# Patient Record
Sex: Male | Born: 1960
Health system: Southern US, Community
[De-identification: ages and names within clinical notes are randomized; demographics above are authoritative.]

## PROBLEM LIST (undated history)

## (undated) DIAGNOSIS — T7840XA Allergy, unspecified, initial encounter: Secondary | ICD-10-CM

## (undated) DIAGNOSIS — R300 Dysuria: Secondary | ICD-10-CM

## (undated) DIAGNOSIS — N529 Male erectile dysfunction, unspecified: Secondary | ICD-10-CM

## (undated) DIAGNOSIS — I10 Essential (primary) hypertension: Secondary | ICD-10-CM

## (undated) DIAGNOSIS — Z8709 Personal history of other diseases of the respiratory system: Secondary | ICD-10-CM

## (undated) DIAGNOSIS — G4700 Insomnia, unspecified: Secondary | ICD-10-CM

## (undated) DIAGNOSIS — K219 Gastro-esophageal reflux disease without esophagitis: Secondary | ICD-10-CM

## (undated) DIAGNOSIS — M1990 Unspecified osteoarthritis, unspecified site: Secondary | ICD-10-CM

## (undated) DIAGNOSIS — T4145XA Adverse effect of unspecified anesthetic, initial encounter: Secondary | ICD-10-CM

## (undated) DIAGNOSIS — R399 Unspecified symptoms and signs involving the genitourinary system: Secondary | ICD-10-CM

## (undated) DIAGNOSIS — Z973 Presence of spectacles and contact lenses: Secondary | ICD-10-CM

## (undated) DIAGNOSIS — G4733 Obstructive sleep apnea (adult) (pediatric): Secondary | ICD-10-CM

## (undated) DIAGNOSIS — I639 Cerebral infarction, unspecified: Secondary | ICD-10-CM

## (undated) DIAGNOSIS — Z860101 Personal history of adenomatous and serrated colon polyps: Secondary | ICD-10-CM

## (undated) DIAGNOSIS — C61 Malignant neoplasm of prostate: Secondary | ICD-10-CM

## (undated) DIAGNOSIS — D126 Benign neoplasm of colon, unspecified: Secondary | ICD-10-CM

## (undated) DIAGNOSIS — J45909 Unspecified asthma, uncomplicated: Secondary | ICD-10-CM

## (undated) DIAGNOSIS — Z923 Personal history of irradiation: Secondary | ICD-10-CM

## (undated) DIAGNOSIS — C801 Malignant (primary) neoplasm, unspecified: Secondary | ICD-10-CM

## (undated) DIAGNOSIS — Z86010 Personal history of colonic polyps: Secondary | ICD-10-CM

## (undated) DIAGNOSIS — E785 Hyperlipidemia, unspecified: Secondary | ICD-10-CM

## (undated) DIAGNOSIS — R310 Gross hematuria: Secondary | ICD-10-CM

## (undated) DIAGNOSIS — F419 Anxiety disorder, unspecified: Secondary | ICD-10-CM

## (undated) DIAGNOSIS — R002 Palpitations: Secondary | ICD-10-CM

## (undated) DIAGNOSIS — I872 Venous insufficiency (chronic) (peripheral): Secondary | ICD-10-CM

## (undated) DIAGNOSIS — T8859XA Other complications of anesthesia, initial encounter: Secondary | ICD-10-CM

## (undated) DIAGNOSIS — G4730 Sleep apnea, unspecified: Secondary | ICD-10-CM

## (undated) DIAGNOSIS — Z720 Tobacco use: Secondary | ICD-10-CM

## (undated) HISTORY — DX: Anxiety disorder, unspecified: F41.9

## (undated) HISTORY — PX: MOUTH SURGERY: SHX715

## (undated) HISTORY — DX: Allergy, unspecified, initial encounter: T78.40XA

## (undated) HISTORY — PX: TYMPANOSTOMY TUBE PLACEMENT: SHX32

## (undated) HISTORY — DX: Unspecified osteoarthritis, unspecified site: M19.90

## (undated) HISTORY — DX: Essential (primary) hypertension: I10

## (undated) HISTORY — DX: Tobacco use: Z72.0

## (undated) HISTORY — DX: Venous insufficiency (chronic) (peripheral): I87.2

## (undated) HISTORY — DX: Cerebral infarction, unspecified: I63.9

## (undated) HISTORY — DX: Benign neoplasm of colon, unspecified: D12.6

## (undated) HISTORY — DX: Unspecified asthma, uncomplicated: J45.909

## (undated) HISTORY — DX: Hyperlipidemia, unspecified: E78.5

## (undated) HISTORY — PX: TONSILLECTOMY: SUR1361

## (undated) HISTORY — DX: Gastro-esophageal reflux disease without esophagitis: K21.9

## (undated) HISTORY — PX: POLYPECTOMY: SHX149

## (undated) HISTORY — DX: Palpitations: R00.2

## (undated) HISTORY — PX: COLONOSCOPY: SHX174

## (undated) HISTORY — PX: BILATERAL KNEE ARTHROSCOPY: SUR91

## (undated) HISTORY — DX: Obstructive sleep apnea (adult) (pediatric): G47.33

## (undated) HISTORY — DX: Insomnia, unspecified: G47.00

## (undated) HISTORY — DX: Sleep apnea, unspecified: G47.30

---

## 1998-06-23 HISTORY — PX: VASECTOMY: SHX75

## 1999-01-22 ENCOUNTER — Emergency Department (HOSPITAL_COMMUNITY): Admission: EM | Admit: 1999-01-22 | Discharge: 1999-01-22 | Payer: Self-pay | Admitting: *Deleted

## 2011-01-24 ENCOUNTER — Emergency Department (HOSPITAL_COMMUNITY)
Admission: EM | Admit: 2011-01-24 | Discharge: 2011-01-24 | Disposition: A | Discharge status: Home / Self Care | Payer: BC Managed Care – PPO | Attending: Emergency Medicine | Admitting: Emergency Medicine

## 2011-01-24 DIAGNOSIS — I10 Essential (primary) hypertension: Secondary | ICD-10-CM | POA: Insufficient documentation

## 2011-01-24 DIAGNOSIS — K625 Hemorrhage of anus and rectum: Secondary | ICD-10-CM | POA: Insufficient documentation

## 2011-01-24 DIAGNOSIS — R109 Unspecified abdominal pain: Secondary | ICD-10-CM | POA: Insufficient documentation

## 2011-01-24 DIAGNOSIS — R112 Nausea with vomiting, unspecified: Secondary | ICD-10-CM | POA: Insufficient documentation

## 2011-01-24 LAB — CBC
HCT: 44.5 % (ref 39.0–52.0)
Hemoglobin: 16 g/dL (ref 13.0–17.0)
MCH: 31.3 pg (ref 26.0–34.0)
MCHC: 36 g/dL (ref 30.0–36.0)
MCV: 86.9 fL (ref 78.0–100.0)
Platelets: 261 10*3/uL (ref 150–400)
RBC: 5.12 MIL/uL (ref 4.22–5.81)
RDW: 14 % (ref 11.5–15.5)
WBC: 11 10*3/uL — ABNORMAL HIGH (ref 4.0–10.5)

## 2011-01-24 LAB — DIFFERENTIAL
Basophils Absolute: 0 10*3/uL (ref 0.0–0.1)
Basophils Relative: 0 % (ref 0–1)
Eosinophils Absolute: 0.3 10*3/uL (ref 0.0–0.7)
Eosinophils Relative: 3 % (ref 0–5)
Lymphocytes Relative: 25 % (ref 12–46)
Lymphs Abs: 2.7 10*3/uL (ref 0.7–4.0)
Monocytes Absolute: 0.8 10*3/uL (ref 0.1–1.0)
Monocytes Relative: 7 % (ref 3–12)
Neutro Abs: 7.2 10*3/uL (ref 1.7–7.7)
Neutrophils Relative %: 65 % (ref 43–77)

## 2011-01-24 LAB — POCT I-STAT, CHEM 8
BUN: 15 mg/dL (ref 6–23)
Calcium, Ion: 1.22 mmol/L (ref 1.12–1.32)
Chloride: 100 mEq/L (ref 96–112)
Creatinine, Ser: 1 mg/dL (ref 0.50–1.35)
Glucose, Bld: 97 mg/dL (ref 70–99)
HCT: 49 % (ref 39.0–52.0)
Hemoglobin: 16.7 g/dL (ref 13.0–17.0)
Potassium: 3.3 mEq/L — ABNORMAL LOW (ref 3.5–5.1)
Sodium: 141 mEq/L (ref 135–145)
TCO2: 31 mmol/L (ref 0–100)

## 2011-01-24 LAB — OCCULT BLOOD, POC DEVICE: Fecal Occult Bld: POSITIVE

## 2011-06-24 HISTORY — PX: COLONOSCOPY: SHX174

## 2011-07-15 ENCOUNTER — Encounter: Payer: Self-pay | Admitting: Gastroenterology

## 2011-07-29 ENCOUNTER — Ambulatory Visit (INDEPENDENT_AMBULATORY_CARE_PROVIDER_SITE_OTHER): Payer: BC Managed Care – PPO | Admitting: Gastroenterology

## 2011-07-29 ENCOUNTER — Encounter: Payer: Self-pay | Admitting: Gastroenterology

## 2011-07-29 VITALS — BP 136/88 | HR 68 | Ht 73.0 in | Wt 231.2 lb

## 2011-07-29 DIAGNOSIS — Z1211 Encounter for screening for malignant neoplasm of colon: Secondary | ICD-10-CM

## 2011-07-29 DIAGNOSIS — G4733 Obstructive sleep apnea (adult) (pediatric): Secondary | ICD-10-CM

## 2011-07-29 DIAGNOSIS — Z83719 Family history of colon polyps, unspecified: Secondary | ICD-10-CM

## 2011-07-29 DIAGNOSIS — Z8371 Family history of colonic polyps: Secondary | ICD-10-CM

## 2011-07-29 MED ORDER — PEG-KCL-NACL-NASULF-NA ASC-C 100 G PO SOLR: 1.0000 | Freq: Once | ORAL | Status: DC

## 2011-07-29 NOTE — Progress Notes (Signed)
History of Present Illness: This is a 51 year old male here to discuss colorectal cancer screening. He had had 2 episodes of minor bright red rectal bleeding associated with bowel movements, once in August and once in December. He was evaluated in the emergency department in August for rectal bleeding in those records were reviewed. He had a normal hemoglobin at that time. His father has colon pol I.yps. Denies weight loss, abdominal pain, constipation, diarrhea, change in stool caliber, melena, nausea, vomiting, dysphagia, reflux symptoms, chest pain.  Not on File Outpatient Prescriptions Prior to Visit  Medication Sig Dispense Refill  . felodipine (PLENDIL) 10 MG 24 hr tablet Take 10 mg by mouth daily.      Marland Kitchen losartan-hydrochlorothiazide (HYZAAR) 100-25 MG per tablet Take 0.5 tablets by mouth daily.      . Nebivolol HCl (BYSTOLIC) 20 MG TABS Take 1 tablet by mouth daily.      Marland Kitchen zolpidem (AMBIEN) 10 MG tablet Take 10 mg by mouth at bedtime as needed.       Past Medical History  Diagnosis Date  . Asthma   . DJD (degenerative joint disease)   . Hypertension   . Obstructive sleep apnea     on CPAP  . Palpitations   . Allergic rhinitis   . Insomnia    Past Surgical History  Procedure Date  . Bilateral knee arthroscopy [id number]  . Tonsillectomy   . Tubes in ears    History   Social History  . Marital Status: Single    Spouse Name: N/A    Number of Children: 3  . Years of Education: N/A   Occupational History  . Sales Rep at [employer]   .     Social History Main Topics  . Smoking status: Current Everyday Smoker -- 1.5 packs/day for 30 years  . Smokeless tobacco: Never Used  . Alcohol Use: Yes     rarely  . Drug Use: No  . Sexually Active: None   Other Topics Concern  . None   Social History Narrative  . None   Family History  Problem Relation Age of Onset  . Arthritis Mother   . Fibromyalgia Mother   . Coronary artery disease Maternal Grandmother   .  Coronary artery disease Maternal Grandfather   . Hypertension Brother   . Skin cancer Brother   . Colon polyps Father    Review of Systems: Pertinent positive and negative review of systems were noted in the above HPI section. All other review of systems were otherwise negative.  Physical Exam: General: Well developed , well nourished, no acute distress Head: Normocephalic and atraumatic Eyes:  sclerae anicteric, EOMI Ears: Normal auditory acuity Mouth: No deformity or lesions Neck: Supple, no masses or thyromegaly Lungs: Clear throughout to auscultation Heart: Regular rate and rhythm; no murmurs, rubs or bruits Abdomen: Soft, non tender and non distended. No masses, hepatosplenomegaly or hernias noted. Normal Bowel sounds Rectal: Deferred to colonoscopy, DRE last week by Dr. Felipa Eth showed no lesions  Musculoskeletal: Symmetrical with no gross deformities  Skin: No lesions on visible extremities Pulses:  Normal pulses noted Extremities: No clubbing, cyanosis, edema or deformities noted Neurological: Alert oriented x 4, grossly nonfocal Cervical Nodes:  No significant cervical adenopathy Inguinal Nodes: No significant inguinal adenopathy Psychological:  Alert and cooperative. Normal mood and affect  Assessment and Recommendations:  1. Colorectal cancer screening. Father with colon polyps. Minor rectal bleeding, likely a benign anorectal source. The risks, benefits, and alternatives to  colonoscopy with possible biopsy, possible destruction of internal hemorrhoids and possible polypectomy were discussed with the patient and they consent to proceed.   2. Obstructive sleep apnea on CPAP.

## 2011-07-29 NOTE — Patient Instructions (Signed)
You have been scheduled for a Colonoscopy with propofol. See separate instructions.  Pick up your prep kit from your pharmacy.  cc: Ravisankar Avva, MD 

## 2011-08-26 ENCOUNTER — Encounter: Payer: Self-pay | Admitting: Gastroenterology

## 2011-08-26 ENCOUNTER — Ambulatory Visit (AMBULATORY_SURGERY_CENTER): Payer: BC Managed Care – PPO | Admitting: Gastroenterology

## 2011-08-26 DIAGNOSIS — D126 Benign neoplasm of colon, unspecified: Secondary | ICD-10-CM

## 2011-08-26 DIAGNOSIS — Z1211 Encounter for screening for malignant neoplasm of colon: Secondary | ICD-10-CM

## 2011-08-26 HISTORY — DX: Benign neoplasm of colon, unspecified: D12.6

## 2011-08-26 MED ORDER — SODIUM CHLORIDE 0.9 % IV SOLN: 500.0000 mL | INTRAVENOUS | Status: DC

## 2011-08-26 NOTE — Patient Instructions (Signed)

## 2011-08-26 NOTE — Progress Notes (Signed)
Patient did not have preoperative order for IV antibiotic SSI prophylaxis. (G8918)  Patient did not experience any of the following events: a burn prior to discharge; a fall within the facility; wrong site/side/patient/procedure/implant event; or a hospital transfer or hospital admission upon discharge from the facility. (G8907)  

## 2011-08-26 NOTE — Op Note (Signed)
[facility] [address] [place], [place]  [address]  COLONOSCOPY PROCEDURE REPORT PATIENT:  Keith Stone, Keith Stone  MR#:  [id number] BIRTHDATE:  1961/04/15, 50 yrs. old  GENDER:  male ENDOSCOPIST:  Judie Petit T. Russella Dar, MD, [facility] Referred by:  Chilton Greathouse, M.D. PROCEDURE DATE:  08/26/2011 PROCEDURE:  Colonoscopy with snare polypectomy ASA CLASS:  Class II INDICATIONS:  1) Routine Risk Screening MEDICATIONS:   MAC sedation, administered by CRNA, propofol (Diprivan) 300 mg IV DESCRIPTION OF PROCEDURE:   After the risks benefits and alternatives of the procedure were thoroughly explained, informed consent was obtained.  Digital rectal exam was performed and revealed no abnormalities.   The LB [id number]-[id number] endoscope was introduced through the anus and advanced to the cecum, which was identified by both the appendix and ileocecal valve, without limitations.  The quality of the prep was excellent, using MoviPrep.  The instrument was then slowly withdrawn as the colon was fully examined. <<PROCEDUREIMAGES>> FINDINGS:  A sessile polyp was found in the descending colon. It was 6 mm in size. Polyp was snared, then cauterized with monopolar cautery. Retrieval was successful. Three polyps were found in the rectum. They were 5 - 6 mm in size. Two polyps were snared without cautery and one with cautery. Retrieval was successful. Otherwise normal colonoscopy without other polyps, masses, vascular ectasias, or inflammatory changes.   Retroflexed views in the rectum revealed no abnormalities.  The time to cecum =  3.67 minutes. The scope was then withdrawn (time =  17.5  min) from the patient and the procedure completed.  COMPLICATIONS:  None  ENDOSCOPIC IMPRESSION: 1) 6 mm sessile polyp in the descending colon 2) 5 - 6 mm Three polyps in the rectum  RECOMMENDATIONS: 1) Hold aspirin, aspirin products, and anti-inflammatory medication for 2 weeks. 2) Await pathology results 3) If  3 or 4 polyps are adenomatous (pre-cancerous), repeat colonoscopy in 3 years. If 1 or 2 adenomatous, repeat colonoscopy in 5 years. Otherwise follow colorectal cancer screening guidelines for "routine risk" patients with colonoscopy in 10 years. Venita Lick. Russella Dar, MD, [facility]  n. eSIGNED:   Venita Lick. Kalena Mander at 08/26/2011 10:16 AM  Gala Romney, [phone]

## 2011-08-27 ENCOUNTER — Telehealth: Payer: Self-pay | Admitting: *Deleted

## 2011-08-27 NOTE — Telephone Encounter (Signed)
  Follow up Call-  Call back number 08/26/2011  Post procedure Call Back phone  # [phone]  Permission to leave phone message Yes     Patient questions:  Do you have a fever, pain , or abdominal swelling? no Pain Score  0 *  Have you tolerated food without any problems? yes  Have you been able to return to your normal activities? yes  Do you have any questions about your discharge instructions: Diet   no Medications  no Follow up visit  no  Do you have questions or concerns about your Care? no  Actions: * If pain score is 4 or above: No action needed, pain <4.

## 2011-09-06 ENCOUNTER — Encounter: Payer: Self-pay | Admitting: Gastroenterology

## 2013-09-01 ENCOUNTER — Encounter: Payer: Self-pay | Admitting: *Deleted

## 2013-09-07 ENCOUNTER — Ambulatory Visit (INDEPENDENT_AMBULATORY_CARE_PROVIDER_SITE_OTHER): Payer: BC Managed Care – PPO | Admitting: Nurse Practitioner

## 2013-09-07 ENCOUNTER — Encounter: Payer: Self-pay | Admitting: Nurse Practitioner

## 2013-09-07 VITALS — BP 134/72 | HR 68 | Ht 73.0 in | Wt 239.0 lb

## 2013-09-07 DIAGNOSIS — R195 Other fecal abnormalities: Secondary | ICD-10-CM | POA: Insufficient documentation

## 2013-09-07 DIAGNOSIS — R1012 Left upper quadrant pain: Secondary | ICD-10-CM | POA: Insufficient documentation

## 2013-09-07 DIAGNOSIS — R110 Nausea: Secondary | ICD-10-CM

## 2013-09-07 DIAGNOSIS — K625 Hemorrhage of anus and rectum: Secondary | ICD-10-CM

## 2013-09-07 MED ORDER — OMEPRAZOLE 40 MG PO CPDR: 40.0000 mg | DELAYED_RELEASE_CAPSULE | Freq: Every day | ORAL | Status: DC

## 2013-09-07 NOTE — Progress Notes (Signed)
     History of Present Illness:  Patient is a 53 year old male known to Dr. Fuller Plan. He had a complete colonoscopy for screening purposes March 2013 at which time 3-4 polyps were removed, one of which was adenomatous in the rest hyperplastic. Patient is referred today for evaluation of a positive hemosure study. No overt bleeding. Over the last couple months he's had intermittent loose stool interspersed with normal bowel habits. No weight loss, weight is actually up. The patient describes what sounds like positive Hemoccult test as well. He takes a baby aspirin daily. No other NSAIDs. Patient does complain of some intermittent nausea and upper abdominal burning especially when stomach is empty.    Patient's wife accompanies him today. They're both concerned about patient's excessive fatigue. He's been requiring naps, unusual for him. Recent labs including a TSH were normal. Of note, patient's blood pressure medication was changed from Coreg to Bystolic for cost reasons not too long ago.   Current Medications, Allergies, Past Medical History, Past Surgical History, Family History and Social History were reviewed in [employer] [employer] record.  Physical Exam: General: Pleasant, well developed , white male in no acute distress Head: Normocephalic and atraumatic Eyes:  sclerae anicteric, conjunctiva pink  Ears: Normal auditory acuity Lungs: Clear throughout to auscultation Heart: Regular rate and rhythm Abdomen: Soft, non distended, non-tender. No masses, no hepatomegaly. Normal bowel sounds Musculoskeletal: Symmetrical with no gross deformities  Extremities: No edema  Neurological: Alert oriented x 4, grossly nonfocal Psychological:  Alert and cooperative. Normal mood and affect  Assessment and Recommendations:  26. 53 year old male referred for a positive Hemosure he had 2 of 3 positive hemoccult tests as well.  Other than some minor bowel changes over the last couple of months (  intermittent loose stool), no other alarm symptoms such as weight loss or overt bleeding and his hgb is 15.2. Patient had a complete colonoscopy March 2013 with findings of 4 small polyps, one of which was adenomatous. Colon neoplasm seems unlikely but will repeat Hemosure.   2. Hemoccult positive stools, 2 of 3 cards. This is not specific to upper of lower bleeding. Patient takes a daily baby ASA. He endorses upper abdominal burning and intermittent nausea with could be NSAID induced gastropathy. Will start him on a PPI. Return to clinic in 3-4 weeks. Depending on clinical course he may need EGD.

## 2013-09-07 NOTE — Progress Notes (Signed)
Reviewed and agree with management plan. Would repeat Hemosure or Hemoccults after he had taken a PPI daily for at least 4 weeks.  Pricilla Riffle. Fuller Plan, MD [facility]

## 2013-09-07 NOTE — Patient Instructions (Addendum)
Your physician has requested that you go to the basement for the following lab work before leaving today: IFOB  You have a follow up appointment with Dr. Fuller Plan on 10-12-2013 at 59 am. If you are having any problems before your appointment please call the office.  We have sent the following medications to your pharmacy for you to pick up at your convenience: Omeprazole 40 mg, please take one capsule by mouth thirty minutes before breakfast

## 2013-10-06 ENCOUNTER — Telehealth: Payer: Self-pay | Admitting: Gastroenterology

## 2013-10-06 ENCOUNTER — Other Ambulatory Visit (INDEPENDENT_AMBULATORY_CARE_PROVIDER_SITE_OTHER): Payer: BC Managed Care – PPO

## 2013-10-06 DIAGNOSIS — K625 Hemorrhage of anus and rectum: Secondary | ICD-10-CM

## 2013-10-06 LAB — FECAL OCCULT BLOOD, IMMUNOCHEMICAL: Fecal Occult Bld: NEGATIVE

## 2013-10-10 ENCOUNTER — Telehealth: Payer: Self-pay | Admitting: Gastroenterology

## 2013-10-10 NOTE — Telephone Encounter (Signed)
Patient notified of stool card results.  Dr. Fuller Plan recommended stool card after 3-4 week on PPI.  Patient reports he has been on for over 1 month.  He asked that I cancel the appt.  He will call back with any new symptoms.

## 2013-10-12 ENCOUNTER — Ambulatory Visit: Payer: BC Managed Care – PPO | Admitting: Gastroenterology

## 2013-10-18 NOTE — Telephone Encounter (Signed)
See 10/10/13 note

## 2013-11-22 HISTORY — PX: PROSTATE BIOPSY: SHX241

## 2014-01-17 ENCOUNTER — Other Ambulatory Visit: Payer: Self-pay | Admitting: Urology

## 2014-02-13 ENCOUNTER — Encounter (HOSPITAL_COMMUNITY): Payer: Self-pay | Admitting: Pharmacist

## 2014-02-15 ENCOUNTER — Other Ambulatory Visit (HOSPITAL_COMMUNITY): Payer: Self-pay | Admitting: Anesthesiology

## 2014-02-15 NOTE — Patient Instructions (Addendum)
[place]  02/15/2014   Your procedure is scheduled on:  Wednesday September 2nd, 2015  Report to [facility] Main Entrance and follow signs to  [place] at 630  AM.  Call this number if you have problems the morning of surgery [phone]   Remember: follow all bowel prep instructions from dr grapey.  Do not eat food  :After Midnight Monday night, clear liquids all day Tuesday sept 1, 2015, no clear liquids after midnight Tuesday night.   BRING CPAP MASK AND TUBING   Take these medicines the morning of surgery with A SIP OF WATER: carvedilol (coreg)                               You may not have any metal on your body including hair pins and piercings  Do not wear jewelry, make-up, lotions, powders, or deodorant.   Men may shave face and neck.  Do not bring valuables to the hospital. [place].  Contacts, dentures or bridgework may not be worn into surgery.  Leave suitcase in the car. After surgery it may be brought to your room.  For patients admitted to the hospital, checkout time is 11:00 AM the day of discharge.   Patients discharged the day of surgery will not be allowed to drive home.  Name and phone number of your driver:  Special Instructions: N/A ________________________________________________________________________  [facility] - Preparing for Surgery Before surgery, you can play an important role.  Because skin is not sterile, your skin needs to be as free of germs as possible.  You can reduce the number of germs on your skin by washing with CHG (chlorahexidine gluconate) soap before surgery.  CHG is an antiseptic cleaner which kills germs and bonds with the skin to continue killing germs even after washing. Please DO NOT use if you have an allergy to CHG or antibacterial soaps.  If your skin becomes reddened/irritated stop using the CHG and inform your nurse when you arrive at Short Stay. Do not shave  (including legs and underarms) for at least 48 hours prior to the first CHG shower.  You may shave your face/neck. Please follow these instructions carefully:  1.  Shower with CHG Soap the night before surgery and the  morning of Surgery.  2.  If you choose to wash your hair, wash your hair first as usual with your  normal  shampoo.  3.  After you shampoo, rinse your hair and body thoroughly to remove the  shampoo.                           4.  Use CHG as you would any other liquid soap.  You can apply chg directly  to the skin and wash                       Gently with a scrungie or clean washcloth.  5.  Apply the CHG Soap to your body ONLY FROM THE NECK DOWN.   Do not use on face/ open                           Wound or open sores. Avoid contact with eyes, ears mouth and genitals (private parts).  Wash face,  Genitals (private parts) with your normal soap.             6.  Wash thoroughly, paying special attention to the area where your surgery  will be performed.  7.  Thoroughly rinse your body with warm water from the neck down.  8.  DO NOT shower/wash with your normal soap after using and rinsing off  the CHG Soap.                9.  Pat yourself dry with a clean towel.            10.  Wear clean pajamas.            11.  Place clean sheets on your bed the night of your first shower and do not  sleep with pets. Day of Surgery : Do not apply any lotions/deodorants the morning of surgery.  Please wear clean clothes to the hospital/surgery center.  FAILURE TO FOLLOW THESE INSTRUCTIONS MAY RESULT IN THE CANCELLATION OF YOUR SURGERY PATIENT SIGNATURE_________________________________  NURSE SIGNATURE__________________________________  ________________________________________________________________________

## 2014-02-16 ENCOUNTER — Ambulatory Visit (HOSPITAL_COMMUNITY)
Admission: RE | Admit: 2014-02-16 | Discharge: 2014-02-16 | Disposition: A | Discharge status: Home / Self Care | Payer: BC Managed Care – PPO | Source: Ambulatory Visit | Attending: Anesthesiology | Admitting: Anesthesiology

## 2014-02-16 ENCOUNTER — Encounter (INDEPENDENT_AMBULATORY_CARE_PROVIDER_SITE_OTHER): Payer: Self-pay

## 2014-02-16 ENCOUNTER — Encounter (HOSPITAL_COMMUNITY)
Admission: RE | Admit: 2014-02-16 | Discharge: 2014-02-16 | Disposition: A | Discharge status: Home / Self Care | Payer: BC Managed Care – PPO | Source: Ambulatory Visit | Attending: Urology | Admitting: Urology

## 2014-02-16 ENCOUNTER — Encounter (HOSPITAL_COMMUNITY): Payer: Self-pay

## 2014-02-16 DIAGNOSIS — C61 Malignant neoplasm of prostate: Secondary | ICD-10-CM | POA: Insufficient documentation

## 2014-02-16 DIAGNOSIS — Z01818 Encounter for other preprocedural examination: Secondary | ICD-10-CM | POA: Diagnosis present

## 2014-02-16 DIAGNOSIS — I10 Essential (primary) hypertension: Secondary | ICD-10-CM | POA: Insufficient documentation

## 2014-02-16 HISTORY — DX: Other complications of anesthesia, initial encounter: T88.59XA

## 2014-02-16 HISTORY — DX: Adverse effect of unspecified anesthetic, initial encounter: T41.45XA

## 2014-02-16 HISTORY — DX: Malignant (primary) neoplasm, unspecified: C80.1

## 2014-02-16 LAB — CBC
HCT: 40.9 % (ref 39.0–52.0)
Hemoglobin: 14.3 g/dL (ref 13.0–17.0)
MCH: 29.7 pg (ref 26.0–34.0)
MCHC: 35 g/dL (ref 30.0–36.0)
MCV: 85 fL (ref 78.0–100.0)
Platelets: 238 10*3/uL (ref 150–400)
RBC: 4.81 MIL/uL (ref 4.22–5.81)
RDW: 12.8 % (ref 11.5–15.5)
WBC: 6.1 10*3/uL (ref 4.0–10.5)

## 2014-02-16 LAB — BASIC METABOLIC PANEL
Anion gap: 15 (ref 5–15)
BUN: 18 mg/dL (ref 6–23)
CO2: 28 mEq/L (ref 19–32)
Calcium: 9.7 mg/dL (ref 8.4–10.5)
Chloride: 97 mEq/L (ref 96–112)
Creatinine, Ser: 0.96 mg/dL (ref 0.50–1.35)
GFR calc Af Amer: >90 mL/min (ref >90)
GFR calc non Af Amer: >90 mL/min (ref >90)
Glucose, Bld: 93 mg/dL (ref 70–99)
Potassium: 3.6 mEq/L — ABNORMAL LOW (ref 3.7–5.3)
Sodium: 140 mEq/L (ref 137–147)

## 2014-02-16 NOTE — Progress Notes (Signed)
EKG DR AVVA 01-23-14 ON CHART EKG 08-02-13 DR AVVA ON CHART

## 2014-02-22 ENCOUNTER — Encounter (HOSPITAL_COMMUNITY): Payer: Self-pay | Admitting: *Deleted

## 2014-02-22 ENCOUNTER — Inpatient Hospital Stay (HOSPITAL_COMMUNITY): Payer: BC Managed Care – PPO | Admitting: Anesthesiology

## 2014-02-22 ENCOUNTER — Inpatient Hospital Stay (HOSPITAL_COMMUNITY)
Admission: RE | Admit: 2014-02-22 | Discharge: 2014-02-23 | DRG: 708 | Disposition: A | Discharge status: Home / Self Care | Payer: BC Managed Care – PPO | Source: Ambulatory Visit | Attending: Urology | Admitting: Urology

## 2014-02-22 ENCOUNTER — Encounter (HOSPITAL_COMMUNITY): Payer: BC Managed Care – PPO | Admitting: Anesthesiology

## 2014-02-22 ENCOUNTER — Encounter (HOSPITAL_COMMUNITY)
Admission: RE | Disposition: A | Discharge status: Home / Self Care | Payer: Self-pay | Source: Ambulatory Visit | Attending: Urology

## 2014-02-22 DIAGNOSIS — G4730 Sleep apnea, unspecified: Secondary | ICD-10-CM | POA: Diagnosis present

## 2014-02-22 DIAGNOSIS — F411 Generalized anxiety disorder: Secondary | ICD-10-CM | POA: Diagnosis present

## 2014-02-22 DIAGNOSIS — Z87891 Personal history of nicotine dependence: Secondary | ICD-10-CM

## 2014-02-22 DIAGNOSIS — I10 Essential (primary) hypertension: Secondary | ICD-10-CM | POA: Diagnosis present

## 2014-02-22 DIAGNOSIS — C61 Malignant neoplasm of prostate: Secondary | ICD-10-CM | POA: Diagnosis present

## 2014-02-22 DIAGNOSIS — Z79899 Other long term (current) drug therapy: Secondary | ICD-10-CM | POA: Diagnosis not present

## 2014-02-22 DIAGNOSIS — Z8249 Family history of ischemic heart disease and other diseases of the circulatory system: Secondary | ICD-10-CM

## 2014-02-22 DIAGNOSIS — Z8349 Family history of other endocrine, nutritional and metabolic diseases: Secondary | ICD-10-CM | POA: Diagnosis not present

## 2014-02-22 HISTORY — PX: ROBOT ASSISTED LAPAROSCOPIC RADICAL PROSTATECTOMY: SHX5141

## 2014-02-22 HISTORY — PX: LYMPHADENECTOMY: SHX5960

## 2014-02-22 LAB — TYPE AND SCREEN
ABO/RH(D): A NEG
Antibody Screen: NEGATIVE

## 2014-02-22 LAB — HEMOGLOBIN AND HEMATOCRIT, BLOOD
HCT: 37.7 % — ABNORMAL LOW (ref 39.0–52.0)
Hemoglobin: 13 g/dL (ref 13.0–17.0)

## 2014-02-22 LAB — ABO/RH: ABO/RH(D): A NEG

## 2014-02-22 SURGERY — ROBOTIC ASSISTED LAPAROSCOPIC RADICAL PROSTATECTOMY
Anesthesia: General

## 2014-02-22 MED ORDER — HYDROMORPHONE HCL PF 1 MG/ML IJ SOLN: 0.2500 mg | INTRAMUSCULAR | Status: DC | PRN

## 2014-02-22 MED ORDER — HEPARIN SODIUM (PORCINE) 1000 UNIT/ML IJ SOLN
INTRAMUSCULAR | Status: AC
  Filled 2014-02-22: qty 1

## 2014-02-22 MED ORDER — SODIUM CHLORIDE 0.9 % IV BOLUS (SEPSIS)
1000.0000 mL | Freq: Once | INTRAVENOUS | Status: AC
  Administered 2014-02-22: 1000 mL via INTRAVENOUS

## 2014-02-22 MED ORDER — PROPOFOL 10 MG/ML IV BOLUS
INTRAVENOUS | Status: AC
  Filled 2014-02-22: qty 20

## 2014-02-22 MED ORDER — BELLADONNA ALKALOIDS-OPIUM 16.2-60 MG RE SUPP
RECTAL | Status: AC
  Filled 2014-02-22: qty 1

## 2014-02-22 MED ORDER — KETOROLAC TROMETHAMINE 15 MG/ML IJ SOLN
15.0000 mg | Freq: Four times a day (QID) | INTRAMUSCULAR | Status: DC
Start: 2014-02-22 — End: 2014-02-23
  Administered 2014-02-22 – 2014-02-23 (×3): 15 mg via INTRAVENOUS
  Filled 2014-02-22 (×5): qty 1

## 2014-02-22 MED ORDER — MEPERIDINE HCL 50 MG/ML IJ SOLN
INTRAMUSCULAR | Status: AC
  Filled 2014-02-22: qty 1

## 2014-02-22 MED ORDER — OXYCODONE HCL 5 MG PO TABS: 5.0000 mg | ORAL_TABLET | Freq: Once | ORAL | Status: DC | PRN

## 2014-02-22 MED ORDER — BUPIVACAINE-EPINEPHRINE (PF) 0.25% -1:200000 IJ SOLN
INTRAMUSCULAR | Status: AC
  Filled 2014-02-22: qty 30

## 2014-02-22 MED ORDER — SODIUM CHLORIDE 0.9 % IJ SOLN
INTRAMUSCULAR | Status: AC
  Filled 2014-02-22: qty 10

## 2014-02-22 MED ORDER — MIDAZOLAM HCL 2 MG/2ML IJ SOLN
0.5000 mg | INTRAMUSCULAR | Status: DC | PRN
  Administered 2014-02-22: 0.5 mg via INTRAVENOUS

## 2014-02-22 MED ORDER — CEFAZOLIN SODIUM-DEXTROSE 2-3 GM-% IV SOLR
2.0000 g | INTRAVENOUS | Status: AC
  Administered 2014-02-22: 2 g via INTRAVENOUS

## 2014-02-22 MED ORDER — HYDROMORPHONE HCL PF 1 MG/ML IJ SOLN
INTRAMUSCULAR | Status: AC
  Filled 2014-02-22: qty 1

## 2014-02-22 MED ORDER — DEXTROSE IN LACTATED RINGERS 5 % IV SOLN
INTRAVENOUS | Status: DC
  Administered 2014-02-22 – 2014-02-23 (×3): via INTRAVENOUS

## 2014-02-22 MED ORDER — PROMETHAZINE HCL 25 MG/ML IJ SOLN: 6.2500 mg | INTRAMUSCULAR | Status: DC | PRN

## 2014-02-22 MED ORDER — BUPIVACAINE-EPINEPHRINE 0.25% -1:200000 IJ SOLN
INTRAMUSCULAR | Status: DC | PRN
  Administered 2014-02-22: 30 mL

## 2014-02-22 MED ORDER — SODIUM CHLORIDE 0.9 % IJ SOLN: 3.0000 mL | INTRAMUSCULAR | Status: DC | PRN

## 2014-02-22 MED ORDER — CIPROFLOXACIN HCL 500 MG PO TABS: 500.0000 mg | ORAL_TABLET | Freq: Two times a day (BID) | ORAL | Status: DC

## 2014-02-22 MED ORDER — OXYCODONE HCL 5 MG/5ML PO SOLN: 5.0000 mg | Freq: Once | ORAL | Status: DC | PRN

## 2014-02-22 MED ORDER — CARVEDILOL 12.5 MG PO TABS
12.5000 mg | ORAL_TABLET | Freq: Two times a day (BID) | ORAL | Status: DC
  Administered 2014-02-22 – 2014-02-23 (×2): 12.5 mg via ORAL
  Filled 2014-02-22 (×4): qty 1

## 2014-02-22 MED ORDER — HEPARIN SODIUM (PORCINE) 1000 UNIT/ML IJ SOLN
INTRAMUSCULAR | Status: DC | PRN
  Administered 2014-02-22: 08:00:00

## 2014-02-22 MED ORDER — ROCURONIUM BROMIDE 100 MG/10ML IV SOLN
INTRAVENOUS | Status: AC
  Filled 2014-02-22: qty 1

## 2014-02-22 MED ORDER — HYDROCODONE-ACETAMINOPHEN 5-325 MG PO TABS: 1.0000 | ORAL_TABLET | Freq: Four times a day (QID) | ORAL | Status: DC | PRN

## 2014-02-22 MED ORDER — HYDROMORPHONE HCL PF 1 MG/ML IJ SOLN
INTRAMUSCULAR | Status: DC | PRN
  Administered 2014-02-22 (×4): 0.5 mg via INTRAVENOUS

## 2014-02-22 MED ORDER — OXYBUTYNIN CHLORIDE 5 MG PO TABS
5.0000 mg | ORAL_TABLET | Freq: Three times a day (TID) | ORAL | Status: DC | PRN
  Filled 2014-02-22: qty 1

## 2014-02-22 MED ORDER — HYDROMORPHONE HCL PF 2 MG/ML IJ SOLN
INTRAMUSCULAR | Status: AC
  Filled 2014-02-22: qty 1

## 2014-02-22 MED ORDER — ZOLPIDEM TARTRATE 10 MG PO TABS: 10.0000 mg | ORAL_TABLET | Freq: Every evening | ORAL | Status: DC | PRN

## 2014-02-22 MED ORDER — HYDROCHLOROTHIAZIDE 12.5 MG PO CAPS
12.5000 mg | ORAL_CAPSULE | Freq: Two times a day (BID) | ORAL | Status: DC
  Administered 2014-02-22 – 2014-02-23 (×2): 12.5 mg via ORAL
  Filled 2014-02-22 (×3): qty 1

## 2014-02-22 MED ORDER — EPHEDRINE SULFATE 50 MG/ML IJ SOLN
INTRAMUSCULAR | Status: DC | PRN
  Administered 2014-02-22: 5 mg via INTRAVENOUS

## 2014-02-22 MED ORDER — HYDROMORPHONE HCL PF 1 MG/ML IJ SOLN
0.2500 mg | INTRAMUSCULAR | Status: DC | PRN
  Administered 2014-02-22 (×4): 0.5 mg via INTRAVENOUS

## 2014-02-22 MED ORDER — CEFAZOLIN SODIUM-DEXTROSE 2-3 GM-% IV SOLR
2.0000 g | Freq: Three times a day (TID) | INTRAVENOUS | Status: AC
  Administered 2014-02-22 – 2014-02-23 (×2): 2 g via INTRAVENOUS
  Filled 2014-02-22 (×2): qty 50

## 2014-02-22 MED ORDER — FELODIPINE ER 10 MG PO TB24
10.0000 mg | ORAL_TABLET | Freq: Every day | ORAL | Status: DC
  Administered 2014-02-22 – 2014-02-23 (×2): 10 mg via ORAL
  Filled 2014-02-22 (×2): qty 1

## 2014-02-22 MED ORDER — ONDANSETRON HCL 4 MG/2ML IJ SOLN
INTRAMUSCULAR | Status: AC
  Filled 2014-02-22: qty 2

## 2014-02-22 MED ORDER — BELLADONNA ALKALOIDS-OPIUM 16.2-60 MG RE SUPP
1.0000 | Freq: Four times a day (QID) | RECTAL | Status: DC | PRN
  Administered 2014-02-22: 1 via RECTAL

## 2014-02-22 MED ORDER — SENNOSIDES-DOCUSATE SODIUM 8.6-50 MG PO TABS
2.0000 | ORAL_TABLET | Freq: Every day | ORAL | Status: DC
  Administered 2014-02-22: 2 via ORAL
  Filled 2014-02-22 (×2): qty 2

## 2014-02-22 MED ORDER — ROCURONIUM BROMIDE 100 MG/10ML IV SOLN
INTRAVENOUS | Status: DC | PRN
  Administered 2014-02-22: 50 mg via INTRAVENOUS
  Administered 2014-02-22: 10 mg via INTRAVENOUS
  Administered 2014-02-22 (×2): 20 mg via INTRAVENOUS
  Administered 2014-02-22: 10 mg via INTRAVENOUS

## 2014-02-22 MED ORDER — LOSARTAN POTASSIUM 50 MG PO TABS
50.0000 mg | ORAL_TABLET | Freq: Two times a day (BID) | ORAL | Status: DC
  Administered 2014-02-22 – 2014-02-23 (×2): 50 mg via ORAL
  Filled 2014-02-22 (×3): qty 1

## 2014-02-22 MED ORDER — NEOSTIGMINE METHYLSULFATE 10 MG/10ML IV SOLN
INTRAVENOUS | Status: DC | PRN
  Administered 2014-02-22: 4 mg via INTRAVENOUS

## 2014-02-22 MED ORDER — EPHEDRINE SULFATE 50 MG/ML IJ SOLN
INTRAMUSCULAR | Status: AC
  Filled 2014-02-22: qty 1

## 2014-02-22 MED ORDER — MORPHINE SULFATE 2 MG/ML IJ SOLN
2.0000 mg | INTRAMUSCULAR | Status: DC | PRN
  Administered 2014-02-22: 4 mg via INTRAVENOUS
  Administered 2014-02-22: 2 mg via INTRAVENOUS
  Administered 2014-02-22 – 2014-02-23 (×3): 4 mg via INTRAVENOUS
  Filled 2014-02-22: qty 1
  Filled 2014-02-22 (×4): qty 2

## 2014-02-22 MED ORDER — ONDANSETRON HCL 4 MG/2ML IJ SOLN: 4.0000 mg | INTRAMUSCULAR | Status: DC | PRN

## 2014-02-22 MED ORDER — SODIUM CHLORIDE 0.9 % IV SOLN: 250.0000 mL | INTRAVENOUS | Status: DC | PRN

## 2014-02-22 MED ORDER — SODIUM CHLORIDE 0.9 % IR SOLN
Status: DC | PRN
  Administered 2014-02-22: 1000 mL

## 2014-02-22 MED ORDER — PROPOFOL 10 MG/ML IV BOLUS
INTRAVENOUS | Status: DC | PRN
  Administered 2014-02-22: 150 mg via INTRAVENOUS

## 2014-02-22 MED ORDER — MEPERIDINE HCL 50 MG/ML IJ SOLN
6.2500 mg | INTRAMUSCULAR | Status: DC | PRN
  Administered 2014-02-22: 12.5 mg via INTRAVENOUS

## 2014-02-22 MED ORDER — FENTANYL CITRATE 0.05 MG/ML IJ SOLN
INTRAMUSCULAR | Status: AC
  Filled 2014-02-22: qty 5

## 2014-02-22 MED ORDER — LACTATED RINGERS IV SOLN
INTRAVENOUS | Status: DC | PRN
  Administered 2014-02-22 (×2): via INTRAVENOUS

## 2014-02-22 MED ORDER — MIDAZOLAM HCL 5 MG/5ML IJ SOLN
INTRAMUSCULAR | Status: DC | PRN
  Administered 2014-02-22: 2 mg via INTRAVENOUS

## 2014-02-22 MED ORDER — SODIUM CHLORIDE 0.9 % IJ SOLN: 3.0000 mL | Freq: Two times a day (BID) | INTRAMUSCULAR | Status: DC

## 2014-02-22 MED ORDER — LIDOCAINE HCL (CARDIAC) 20 MG/ML IV SOLN
INTRAVENOUS | Status: AC
  Filled 2014-02-22: qty 5

## 2014-02-22 MED ORDER — ACETAMINOPHEN 325 MG PO TABS: 650.0000 mg | ORAL_TABLET | ORAL | Status: DC | PRN

## 2014-02-22 MED ORDER — LOSARTAN POTASSIUM-HCTZ 100-25 MG PO TABS: 0.5000 | ORAL_TABLET | Freq: Two times a day (BID) | ORAL | Status: DC

## 2014-02-22 MED ORDER — MIDAZOLAM HCL 2 MG/2ML IJ SOLN
INTRAMUSCULAR | Status: AC
  Filled 2014-02-22: qty 2

## 2014-02-22 MED ORDER — OXYCODONE-ACETAMINOPHEN 5-325 MG PO TABS
1.0000 | ORAL_TABLET | ORAL | Status: DC | PRN
  Administered 2014-02-22: 1 via ORAL
  Administered 2014-02-22 – 2014-02-23 (×2): 2 via ORAL
  Filled 2014-02-22 (×2): qty 2
  Filled 2014-02-22: qty 1

## 2014-02-22 MED ORDER — LIDOCAINE HCL (CARDIAC) 20 MG/ML IV SOLN
INTRAVENOUS | Status: DC | PRN
  Administered 2014-02-22: 50 mg via INTRAVENOUS

## 2014-02-22 MED ORDER — GLYCOPYRROLATE 0.2 MG/ML IJ SOLN
INTRAMUSCULAR | Status: DC | PRN
  Administered 2014-02-22: .7 mg via INTRAVENOUS

## 2014-02-22 MED ORDER — STERILE WATER FOR IRRIGATION IR SOLN
Status: DC | PRN
  Administered 2014-02-22: 3000 mL

## 2014-02-22 MED ORDER — FENTANYL CITRATE 0.05 MG/ML IJ SOLN
INTRAMUSCULAR | Status: DC | PRN
  Administered 2014-02-22 (×3): 50 ug via INTRAVENOUS
  Administered 2014-02-22: 100 ug via INTRAVENOUS

## 2014-02-22 MED ORDER — LACTATED RINGERS IV SOLN: INTRAVENOUS | Status: DC

## 2014-02-22 MED ORDER — ONDANSETRON HCL 4 MG/2ML IJ SOLN
INTRAMUSCULAR | Status: DC | PRN
  Administered 2014-02-22: 4 mg via INTRAVENOUS

## 2014-02-22 MED ORDER — CEFAZOLIN SODIUM-DEXTROSE 2-3 GM-% IV SOLR
INTRAVENOUS | Status: AC
  Filled 2014-02-22: qty 50

## 2014-02-22 SURGICAL SUPPLY — 50 items
APPLICATOR SURGIFLO ENDO (HEMOSTASIS) ×3 IMPLANT
CABLE HIGH FREQUENCY MONO STRZ (ELECTRODE) ×3 IMPLANT
CATH FOLEY 2WAY SLVR 18FR 30CC (CATHETERS) ×3 IMPLANT
CATH ROBINSON RED A/P 16FR (CATHETERS) ×3 IMPLANT
CATH TIEMANN FOLEY 18FR 5CC (CATHETERS) ×3 IMPLANT
CHLORAPREP W/TINT 26ML (MISCELLANEOUS) ×3 IMPLANT
CLIP LIGATING HEM O LOK PURPLE (MISCELLANEOUS) IMPLANT
CLOTH BEACON ORANGE TIMEOUT ST (SAFETY) ×3 IMPLANT
COVER SURGICAL LIGHT HANDLE (MISCELLANEOUS) ×3 IMPLANT
COVER TIP SHEARS 8 DVNC (MISCELLANEOUS) ×2 IMPLANT
COVER TIP SHEARS 8MM DA VINCI (MISCELLANEOUS) ×1
CUTTER ECHEON FLEX ENDO 45 340 (ENDOMECHANICALS) ×3 IMPLANT
DECANTER SPIKE VIAL GLASS SM (MISCELLANEOUS) IMPLANT
DRAPE SURG IRRIG POUCH 19X23 (DRAPES) ×3 IMPLANT
DRSG TEGADERM 2-3/8X2-3/4 SM (GAUZE/BANDAGES/DRESSINGS) ×12 IMPLANT
DRSG TEGADERM 4X4.75 (GAUZE/BANDAGES/DRESSINGS) ×6 IMPLANT
DRSG TEGADERM 6X8 (GAUZE/BANDAGES/DRESSINGS) ×6 IMPLANT
ELECT REM PT RETURN 9FT ADLT (ELECTROSURGICAL) ×3
ELECTRODE REM PT RTRN 9FT ADLT (ELECTROSURGICAL) ×2 IMPLANT
GAUZE SPONGE 2X2 8PLY STRL LF (GAUZE/BANDAGES/DRESSINGS) ×2 IMPLANT
GLOVE BIO SURGEON STRL SZ 6.5 (GLOVE) ×3 IMPLANT
GLOVE BIOGEL M STRL SZ7.5 (GLOVE) ×6 IMPLANT
GOWN STRL REUS W/TWL LRG LVL3 (GOWN DISPOSABLE) ×9 IMPLANT
HOLDER FOLEY CATH W/STRAP (MISCELLANEOUS) ×3 IMPLANT
IV LACTATED RINGERS 1000ML (IV SOLUTION) IMPLANT
KIT ACCESSORY DA VINCI DISP (KITS) ×1
KIT ACCESSORY DVNC DISP (KITS) ×2 IMPLANT
NDL SAFETY ECLIPSE 18X1.5 (NEEDLE) ×2 IMPLANT
NEEDLE HYPO 18GX1.5 SHARP (NEEDLE) ×1
PACK ROBOT UROLOGY CUSTOM (CUSTOM PROCEDURE TRAY) ×3 IMPLANT
PEN SKIN MARKING BROAD (MISCELLANEOUS) ×3 IMPLANT
RELOAD GREEN ECHELON 45 (STAPLE) ×3 IMPLANT
SEALER TISSUE G2 CVD JAW 45CM (ENDOMECHANICALS) IMPLANT
SET TUBE IRRIG SUCTION NO TIP (IRRIGATION / IRRIGATOR) ×3 IMPLANT
SOLUTION ELECTROLUBE (MISCELLANEOUS) ×3 IMPLANT
SPONGE GAUZE 2X2 STER 10/PKG (GAUZE/BANDAGES/DRESSINGS) ×1
STAPLER VISISTAT 35W (STAPLE) ×3 IMPLANT
SURGIFLO W/THROMBIN 8M KIT (HEMOSTASIS) ×3 IMPLANT
SUT ETHILON 3 0 PS 1 (SUTURE) ×3 IMPLANT
SUT VIC AB 0 CT1 27 (SUTURE) ×1
SUT VIC AB 0 CT1 27XBRD ANTBC (SUTURE) ×2 IMPLANT
SUT VIC AB 2-0 SH 27 (SUTURE) ×2
SUT VIC AB 2-0 SH 27X BRD (SUTURE) ×4 IMPLANT
SUT VICRYL 0 UR6 27IN ABS (SUTURE) ×6 IMPLANT
SUT VLOC BARB 180 ABS3/0GR12 (SUTURE) ×6
SUTURE VLOC BRB 180 ABS3/0GR12 (SUTURE) ×4 IMPLANT
SYR 27GX1/2 1ML LL SAFETY (SYRINGE) ×3 IMPLANT
TOWEL OR 17X26 10 PK STRL BLUE (TOWEL DISPOSABLE) ×3 IMPLANT
TOWEL OR NON WOVEN STRL DISP B (DISPOSABLE) ×3 IMPLANT
WATER STERILE IRR 1500ML POUR (IV SOLUTION) IMPLANT

## 2014-02-22 NOTE — Transfer of Care (Signed)
Immediate Anesthesia Transfer of Care Note  Patient: Keith Stone  Procedure(s) Performed: Procedure(s) (LRB): ROBOTIC ASSISTED LAPAROSCOPIC RADICAL PROSTATECTOMY (N/A) PELVIC LYMPH NODE DISSECTION (Bilateral)  Patient Location: PACU  Anesthesia Type: General  Level of Consciousness: sedated, patient cooperative and responds to stimulation  Airway & Oxygen Therapy: Patient Spontanous Breathing and Patient connected to face mask oxgen  Post-op Assessment: Report given to PACU RN and Post -op Vital signs reviewed and stable  Post vital signs: Reviewed and stable  Complications: No apparent anesthesia complications

## 2014-02-22 NOTE — Interval H&P Note (Signed)
History and Physical Interval Note:  02/22/2014 8:20 AM  Keith Stone  has presented today for surgery, with the diagnosis of PROSTATE CANCER  The various methods of treatment have been discussed with the patient and family. After consideration of risks, benefits and other options for treatment, the patient has consented to  Procedure(s): ROBOTIC ASSISTED LAPAROSCOPIC RADICAL PROSTATECTOMY (N/A) PELVIC LYMPH NODE DISSECTION (Bilateral) as a surgical intervention .  The patient's history has been reviewed, patient examined, no change in status, stable for surgery.  I have reviewed the patient's chart and labs.  Questions were answered to the patient's satisfaction.     Keith Stone S

## 2014-02-22 NOTE — Discharge Instructions (Signed)

## 2014-02-22 NOTE — Anesthesia Postprocedure Evaluation (Signed)
Anesthesia Post Note  Patient: Keith Stone  Procedure(s) Performed: Procedure(s) (LRB): ROBOTIC ASSISTED LAPAROSCOPIC RADICAL PROSTATECTOMY (N/A) PELVIC LYMPH NODE DISSECTION (Bilateral)  Anesthesia type: General  Patient location: PACU  Post pain: Pain level controlled  Post assessment: Post-op Vital signs reviewed  Last Vitals: BP 112/65  Pulse 69  Temp(Src) 36.7 C (Oral)  Resp 16  Ht 6\' 1"  (1.854 m)  Wt 229 lb (103.874 kg)  BMI 30.22 kg/m2  SpO2 100%  Post vital signs: Reviewed  Level of consciousness: sedated  Complications: No apparent anesthesia complications

## 2014-02-22 NOTE — Progress Notes (Signed)
Patient ambulated in hallway >300 feet. Patient tolerated well. Will encourage incentive spirometer. Will continue to monitor patient.Setzer, Marchelle Folks

## 2014-02-22 NOTE — Anesthesia Preprocedure Evaluation (Addendum)
Anesthesia Evaluation  Patient identified by MRN, date of birth, ID band Patient awake    Reviewed: Allergy & Precautions, H&P , NPO status , Patient's Chart, lab work & pertinent test results  Airway Mallampati: II TM Distance: >3 FB Neck ROM: Full    Dental no notable dental hx.    Pulmonary asthma , sleep apnea , Current Smoker,  breath sounds clear to auscultation  Pulmonary exam normal       Cardiovascular hypertension, Pt. on medications Rhythm:Regular Rate:Normal     Neuro/Psych negative neurological ROS  negative psych ROS   GI/Hepatic negative GI ROS, Neg liver ROS,   Endo/Other  negative endocrine ROS  Renal/GU negative Renal ROS     Musculoskeletal  (+) Arthritis -,   Abdominal   Peds  Hematology negative hematology ROS (+)   Anesthesia Other Findings   Reproductive/Obstetrics negative OB ROS                          Anesthesia Physical Anesthesia Plan  ASA: II  Anesthesia Plan: General   Post-op Pain Management:    Induction: Intravenous  Airway Management Planned:   Additional Equipment:   Intra-op Plan:   Post-operative Plan: Extubation in OR  Informed Consent: I have reviewed the patients History and Physical, chart, labs and discussed the procedure including the risks, benefits and alternatives for the proposed anesthesia with the patient or authorized representative who has indicated his/her understanding and acceptance.   Dental advisory given  Plan Discussed with: CRNA  Anesthesia Plan Comments:         Anesthesia Quick Evaluation

## 2014-02-22 NOTE — H&P (Signed)
Reason For Visit Keith Stone presents today to undergo robotic-assisted laparoscopic prostatectomy with bilateral pelvic lymph node dissection.  History of Present Illness    Keith Stone is currently 53 years of age. He was sent to see [place] by Dr. Dagmar Hait secondary to a rising PSA. His PSA increased from 3.3-5.5 over the course of approximately 18 months. Rectal exam did reveal some nodularity in the right lobe of the prostate.  Ultrasound revealed a 22 g prostate with some hypoechoic areas in primarily the right midportion of the prostate. Biopsies unfortunately were positive. Patient was noted to have bilateral prostate cancer. Majority of his tumor was indeed in the right lobe of the prostate where the palpable abnormality and ultrasound findings were located. Patient had 5 out of 6 biopsy cores positive on the right side. Majority of the tumor was Gleason 3 +3 =l 6 but there was one core of Gleason's was 3+ 4 equals 7 in the right midportion of the prostate. On the left side to cores were positive at the left apex for Gleason's 3+3 = 6 disease. Overall the patient has an intermediate risk prostate cancer.  Clinical stage Pt2b   Past Medical History Problems  1. History of Anxiety (300.00) 2. History of cardiac arrhythmia (V12.59) 3. History of heartburn (V12.79) 4. History of hypertension (V12.59) 5. History of sleep apnea (V13.89)  Surgical History Problems  1. History of Knee Surgery  Current Meds 1. Ambien 10 MG Oral Tablet;  Therapy: (Recorded:02Jun2015) to Recorded 2. Carvedilol 12.5 MG Oral Tablet;  Therapy: (Recorded:02Jun2015) to Recorded 3. Felodipine ER 10 MG Oral Tablet Extended Release 24 Hour;  Therapy: (WFUXNATF:[id number]) to Recorded 4. Hydrocodone-Acetaminophen 5-325 MG Oral Tablet;  Therapy: (YHCWCBJS:[id number]) to Recorded 5. Losartan Potassium-HCTZ 100-25 MG Oral Tablet;  Therapy: (Recorded:02Jun2015) to Recorded  Allergies Medication  1. No Known Drug  Allergies  Family History Problems  1. Family history of cardiac disorder (V17.49) : Father 2. Family history of gout (V18.19) : Father 3. Family history of hypertension (V17.49) : Father 4. Family history of kidney stones (V18.69) : Father 5. No significant family history : Mother  Social History Problems  1. Alcohol use (V49.89)   socially 2. Caffeine use (V49.89)   2 qd 3. Father's age   43yrs 4. Former smoker (V15.82) 5. Married 6. Mother's age   32yrs 7. Occupation   Press photographer 8. Three children  Vitals Blood Pressure: 135 / 81 Temperature: 98.5 F Heart Rate: 75  Physical Exam Constitutional: Well nourished and well developed . No acute distress.  ENT:. The ears and nose are normal in appearance.  Neck: The appearance of the neck is normal and no neck mass is present.  Pulmonary: No respiratory distress and normal respiratory rhythm and effort.  Cardiovascular: Heart rate and rhythm are normal . No peripheral edema.  Abdomen: The abdomen is soft and nontender. No masses are palpated. No CVA tenderness. No hernias are palpable. No hepatosplenomegaly noted.  Rectal: Rectal exam demonstrates normal sphincter tone, no tenderness and no masses. Estimated prostate size is 2+. The prostate has a palpable nodule involving the right, apex, mid aspect of the prostate which appears to be confined within the prostate capsule and is not tender. The left seminal vesicle is nonpalpable. The right seminal vesicle is nonpalpable. The perineum is normal on inspection.  Genitourinary: Examination of the penis demonstrates no discharge, no masses, no lesions and a normal meatus. The scrotum is without lesions. The right epididymis is palpably normal and non-tender. The  left epididymis is palpably normal and non-tender. The right testis is non-tender and without masses. The left testis is non-tender and without masses.  Lymphatics: The femoral and inguinal nodes are not enlarged or tender.   Skin: Normal skin turgor, no visible rash and no visible skin lesions.  Neuro/Psych:. Mood and affect are appropriate.   Assessment Assessed  1. Prostate cancer (185)  Plan Prostate cancer  1. Follow-up Schedule Surgery Office  Follow-up  Status: Complete  Done: [id number]  Discussion/Summary  The patient was counseled about the natural history of prostate cancer and the standard treatment options that are available for prostate cancer. It was explained to him how his age and life expectancy, clinical stage, Gleason score, and PSA affect his prognosis, the decision to proceed with additional staging studies, as well as how that information influences recommended treatment strategies. We discussed the roles for active surveillance, radiation therapy, surgical therapy, androgen deprivation, as well as ablative therapy options for the treatment of prostate cancer as appropriate to his individual cancer situation. We discussed the risks and benefits of these options with regard to their impact on cancer control and also in terms of potential adverse events, complications, and impact on quiality of life particularly related to urinary, bowel, and sexual function. The patient was encouraged to ask questions throughout the discussion today and all questions were answered to his stated satisfaction. In addition, the patient was provided with and/or directed to appropriate resources and literature for further education about prostate cancer and treatment options.   We discussed surgical therapy for prostate cancer including the different available surgical approaches. We discussed, in detail, the risks and expectations of surgery with regard to cancer control, urinary control, and erectile function as well as the expected postoperative recovery process. The risks, potential complications/adverse events of radical prostatectomy as well as alternative options were explained to the patient.   We discussed  surgical therapy for prostate cancer including the different available surgical approaches. We discussed, in detail, the risks and expectations of surgery with regard to cancer control, urinary control, and erectile function as well as the expected postoperative recovery process. Additional risks of surgery including but not limited to bleeding, infection, hernia formation, nerve damage, lymphocele formation, bowel/rectal injury potentially necessitating colostomy, damage to the urinary tract resulting in urine leakage, urethral stricture, and the cardiopulmonary risks such as myocardial infarction, stroke, death, venothromboembolism, etc. were explained. The risk of open surgical conversion for robotic/laparoscopic prostatectomy was also discussed.   50 minutes were spent in face to face consultation with patient today.     Patient would like to proceed with robotic prostatectomy at this time.

## 2014-02-22 NOTE — Progress Notes (Signed)
Day of Surgery Subjective: Doing well post-operatively. Ambulated in hall. Pain controlled. Eating full liquids on exam without difficulty.   Objective: Vital signs in last 24 hours: Temp:  [97.8 F (36.6 C)-98.4 F (36.9 C)] 98.1 F (36.7 C) (09/02 1427) Pulse Rate:  [53-70] 69 (09/02 1427) Resp:  [12-19] 16 (09/02 1427) BP: (112-140)/(65-94) 112/65 mmHg (09/02 1427) SpO2:  [98 %-100 %] 100 % (09/02 1427) Weight:  [103.874 kg (229 lb)] 103.874 kg (229 lb) (09/02 0716)  Intake/Output from previous day:   Intake/Output this shift: Total I/O In: 3375 [I.V.:2375; IV Piggyback:1000] Out: 1675 [Urine:1550; Drains:75; Blood:50]  Physical Exam:  General: Alert and oriented CV: RRR Lungs: Clear Abdomen: Soft, ND Incisions: C/D/I Foley: blood tinged JP: thin, serosanguinous Ext: NT, No erythema  Lab Results:  Recent Labs  02/22/14 1229  HGB 13.0  HCT 37.7*   Assessment/Plan: Post-op check after RALP/PLND. Doing well.  Routine post-RALP care Home meds Advance diet at tolerated Ambulation Encourage PO pain meds   LOS: 0 days   Milon Score 02/22/2014, 6:43 PM

## 2014-02-23 ENCOUNTER — Encounter (HOSPITAL_COMMUNITY): Payer: Self-pay | Admitting: Urology

## 2014-02-23 LAB — HEMOGLOBIN AND HEMATOCRIT, BLOOD
HCT: 33.9 % — ABNORMAL LOW (ref 39.0–52.0)
Hemoglobin: 11.4 g/dL — ABNORMAL LOW (ref 13.0–17.0)

## 2014-02-23 LAB — BASIC METABOLIC PANEL
Anion gap: 9 (ref 5–15)
BUN: 7 mg/dL (ref 6–23)
CO2: 30 mEq/L (ref 19–32)
Calcium: 8.3 mg/dL — ABNORMAL LOW (ref 8.4–10.5)
Chloride: 101 mEq/L (ref 96–112)
Creatinine, Ser: 0.88 mg/dL (ref 0.50–1.35)
GFR calc Af Amer: >90 mL/min (ref >90)
GFR calc non Af Amer: >90 mL/min (ref >90)
Glucose, Bld: 110 mg/dL — ABNORMAL HIGH (ref 70–99)
Potassium: 3.3 mEq/L — ABNORMAL LOW (ref 3.7–5.3)
Sodium: 140 mEq/L (ref 137–147)

## 2014-02-23 NOTE — Op Note (Signed)
Preoperative diagnosis: Clinical stage T1c Adenocarcinoma prostate  Postoperative diagnosis: Same  Procedure: Robotic-assisted laparoscopic radical retropubic prostatectomy with bilateral pelvic lymph node dissection  Surgeon: Bernestine Amass, MD  Asst.: Leta Baptist, PA, Amaryllis Dyke, MD Anesthesia: Gen. Endotracheal  Indications: Patient was diagnosed with clinical stage TIc Adenocarcinoma the prostate. He underwent extensive consultation with regard to treatment options. The patient decided on a surgical approach. He appeared to understand the distinct advantages as well as the disadvantages of this procedure. The patient has performed a mechanical bowel prep. He has had placement of PAS compression boots and has received perioperative antibiotics. The patient's preoperative PSA was 5.5. Ultrasound revealed a 22 g prostate.   Technique and findings:The patient was brought to the operating room and had successful induction of general endotracheal anesthesia.the patient was placed in a low lithotomy position with careful padding of all extremities. He was secured to the operative table and placed in the steep Trendelenburg position. He was prepped and draped in usual manner. A Foley catheter was placed sterilely on the field. Camera port site was chosen 18 cm above the pubic symphysis just to the left of the umbilicus. A standard open Hassan technique was utilized. A 12 mm trocar was placed without difficulty. The camera was then inserted and no abnormalities were noted within the pelvis. The trochars were placed with direct visual guidance. This included 3 22mm robotic trochars and a 12 mm and 5 mm assist ports. Once all the ports were placed the robot was docked. The bladder was filled and the space of Retzius was developed with electrocautery dissection as well as blunt dissection. Superficial fat off the endopelvic fascia and bladder neck was removed with electrocautery scissors. The endopelvic  fascia was then incised bilaterally from base to apex. Levator musculature was swept off the apex of the prostate isolating the dorsal venous complex which was then stapled with the ETS stapling device. The anterior bladder neck was identified with the aid of the Foley balloon. This was then transected down to the Foley catheter with electrocautery scissors. The Foley catheter was then retracted anteriorly.We appeared to be well away from the ureteral orifices. The posterior bladder neck was then transected and the dissection carried down to the adnexal structures. The seminal vesicles and vas deferens on both sides were then individually dissected free and retracted anteriorly. The posterior plane between the rectum and prostate was then established primarily with blunt dissection.  Attention was then turned towards nerve sparing. The patient was felt to be a candidate for bilateral nerve sparing. Superficial fascia along the anterior lateral aspect of the prostate was incised bilaterally. This tissue was then swept laterally until we were able to establish a groove between the neurovascular tissue and the posterior lateral aspect on the prostate bilaterally. This groove was then extended from the apex back to the base of the prostate. With the prostate retracted anteriorly the vascular pedicles of the prostate were taken with hemolock clips. The Foley catheter was then reinserted and the anterior urethra was transected. The posterior urethra was then transected as were some rectourethralis fibers. The prostate was then removed from the pelvis. The pelvis was then copiously irrigated. Rectal insufflation was performed and there was no evidence of rectal injury.  Attention was then turned towards bilateral pelvic lymph node dissection. The obturator node packets were removed I laterally and the dissection extended towards the bifurcation of the iliac artery. The obturator nerve was identified on both sides and  preserved.  Hemalock clips were used for small veins and lymphatic channels. The node packets were sent for permanent analysis.  Attention was then turned towards reconstruction. The bladder neck did not require any reconstruction. The bladder neck and posterior urethra were reapproximated at the 6:00 position utilizing a 2-0 Vicryl suture. The rest of the anastomosis was done with a double-armed 3-0 V-lock suture in a 360 degree manner.  A new catheter was placed and bladder irrigation revealed no evidence of leakage. A Blake drain was placed through one of the robotic trochars and positioned in the retropubic space above the anastomosis. This was then secured to the skin with a nylon suture. The prostate was placed in the Endopouch retrieval bag. The 12 mm trocar site was closed with a Vicryl suture with the aid of a suture passer. Our other trochars were taken out with direct visual guidance without evidence of any bleeding. The camera port incision was extended slightly to allow for removal of the specimen and then closed with a running Vicryl suture. All port sites were infiltrated with Marcaine and then closed with surgical clips. The patient was then taken to recovery room having had no obvious complications or problems. Sponge and needle counts were correct.

## 2014-02-23 NOTE — Progress Notes (Signed)
1 Day Post-Op Subjective: Patient reports no concerns or complaints. He is ambulating well. Very good pain control.  Objective: Vital signs in last 24 hours: Temp:  [98.1 F (36.7 C)-98.7 F (37.1 C)] 98.2 F (36.8 C) (09/03 0626) Pulse Rate:  [53-69] 57 (09/03 0626) Resp:  [12-19] 18 (09/03 0626) BP: (110-140)/(59-85) 114/59 mmHg (09/03 0626) SpO2:  [94 %-100 %] 95 % (09/03 0626)  Intake/Output from previous day: 09/02 0701 - 09/03 0700 In: 4970 [P.O.:720; I.V.:3250; IV Piggyback:1000] Out: 3185 [Urine:3000; Drains:135; Blood:50] Intake/Output this shift:    Physical Exam:  General:alert and no distress Cardiovascular:  Lungs:  GI: not done and soft Incisions:  Urine: Extremities:  Lab Results:  Recent Labs  02/22/14 1229 02/23/14 0440  HGB 13.0 11.4*  HCT 37.7* 33.9*   BMET  Recent Labs  02/23/14 0440  NA 140  K 3.3*  CL 101  CO2 30  GLUCOSE 110*  BUN 7  CREATININE 0.88  CALCIUM 8.3*   No results found for this basename: LABPT, INR,  in the last 72 hours No results found for this basename: LABURIN,  in the last 72 hours Results for orders placed in visit on 10/06/13  FECAL OCCULT BLOOD, IMMUNOCHEMICAL     Status: None   Collection Time    10/06/13  5:01 PM      Result Value Ref Range Status   Fecal Occult Bld Negative  Negative Final    Studies/Results: No results found.  Assessment/Plan: 1 Day Post-Op, Procedure(s) (LRB): ROBOTIC ASSISTED LAPAROSCOPIC RADICAL PROSTATECTOMY (N/A) PELVIC LYMPH NODE DISSECTION (Bilateral)  Transition to PO pain medications D/C pelvic drain Plan on discharge later today after additional ambulation and instructions  LOS: 1 day   Donnivan Villena S 02/23/2014, 7:40 AM

## 2014-02-23 NOTE — Progress Notes (Signed)
Patient in hallway this am ambulating. Patient just finished lunch and tolerated well. Also passing gas at this time. No complaints of pain. Will go over discharge paper work per order. Setzer, Marchelle Folks

## 2014-02-23 NOTE — Discharge Summary (Signed)
  Date of admission: 02/22/2014  Date of discharge: 02/23/2014  Admission diagnosis: Prostate Cancer  Discharge diagnosis: Prostate Cancer  History and Physical: For full details, please see admission history and physical. Briefly, Keith Stone is a 53 y.o. gentleman with localized prostate cancer.  After discussing management/treatment options, he elected to proceed with surgical treatment.  Hospital Course: Keith Stone was taken to the operating room on 02/22/2014 and underwent a robotic assisted laparoscopic radical prostatectomy. He tolerated this procedure well and without complications. Postoperatively, he was able to be transferred to a regular hospital room following recovery from anesthesia.  He was able to begin ambulating the night of surgery. He remained hemodynamically stable overnight.  He had excellent urine output with appropriately minimal output from his pelvic drain and his pelvic drain was removed on POD #1.  He was transitioned to oral pain medication, tolerated a clear liquid diet, and had met all discharge criteria and was able to be discharged home later on POD#1.  Laboratory values:  Recent Labs  02/22/14 1229 02/23/14 0440  HGB 13.0 11.4*  HCT 37.7* 33.9*    Disposition: Home  Discharge instruction: He was instructed to be ambulatory but to refrain from heavy lifting, strenuous activity, or driving. He was instructed on urethral catheter care.  Discharge medications:     Medication List         carvedilol 12.5 MG tablet  Commonly known as:  COREG  Take 12.5 mg by mouth 2 (two) times daily with a meal.     ciprofloxacin 500 MG tablet  Commonly known as:  CIPRO  Take 1 tablet (500 mg total) by mouth 2 (two) times daily. Start day prior to office visit for foley removal     felodipine 10 MG 24 hr tablet  Commonly known as:  PLENDIL  Take 10 mg by mouth daily.     HYDROcodone-acetaminophen 5-325 MG per tablet  Commonly known as:  NORCO/VICODIN   Take 1-2 tablets by mouth every 6 (six) hours as needed for moderate pain.     losartan-hydrochlorothiazide 100-25 MG per tablet  Commonly known as:  HYZAAR  Take 0.5 tablets by mouth 2 (two) times daily.     zolpidem 10 MG tablet  Commonly known as:  AMBIEN  Take 10 mg by mouth at bedtime as needed.        Followup: He will followup in 1 week for catheter removal and to discuss his surgical pathology results.

## 2014-02-23 NOTE — Care Management Note (Signed)
    Page 1 of 1   02/23/2014     10:43:28 AM CARE MANAGEMENT NOTE 02/23/2014  Patient:  Keith Stone, Keith Stone   Account Number:  [id number]  Date Initiated:  02/23/2014  Documentation initiated by:  Dessa Phi  Subjective/Objective Assessment:   53 Y/O M ADMITTED W/PROSTATE CA.     Action/Plan:   FROM HOME.   Anticipated DC Date:  02/23/2014   Anticipated DC Plan:  [place]  CM consult      Choice offered to / List presented to:             Status of service:  Completed, signed off Medicare Important Message given?   (If response is "NO", the following Medicare IM given date fields will be blank) Date Medicare IM given:   Medicare IM given by:   Date Additional Medicare IM given:   Additional Medicare IM given by:    Discharge Disposition:  HOME/SELF CARE  Per UR Regulation:  Reviewed for med. necessity/level of care/duration of stay  If discussed at [place] of Stay Meetings, dates discussed:    Comments:  02/23/14 Khloe Hunkele RN,BSN NCM [phone] D/C HOME NO [place].

## 2014-02-23 NOTE — Progress Notes (Signed)
Patient ambulated in hallway 100 feet with minimal assistance. Patient tolerated well. Will continue to monitor patient.

## 2015-10-31 DIAGNOSIS — M5380 Other specified dorsopathies, site unspecified: Secondary | ICD-10-CM | POA: Diagnosis not present

## 2015-10-31 DIAGNOSIS — G4733 Obstructive sleep apnea (adult) (pediatric): Secondary | ICD-10-CM | POA: Diagnosis not present

## 2015-10-31 DIAGNOSIS — Z6832 Body mass index (BMI) 32.0-32.9, adult: Secondary | ICD-10-CM | POA: Diagnosis not present

## 2015-10-31 DIAGNOSIS — Z8546 Personal history of malignant neoplasm of prostate: Secondary | ICD-10-CM | POA: Diagnosis not present

## 2015-10-31 DIAGNOSIS — M5137 Other intervertebral disc degeneration, lumbosacral region: Secondary | ICD-10-CM | POA: Diagnosis not present

## 2015-10-31 DIAGNOSIS — C61 Malignant neoplasm of prostate: Secondary | ICD-10-CM | POA: Diagnosis not present

## 2015-12-04 DIAGNOSIS — E291 Testicular hypofunction: Secondary | ICD-10-CM | POA: Diagnosis not present

## 2015-12-04 DIAGNOSIS — Z8546 Personal history of malignant neoplasm of prostate: Secondary | ICD-10-CM | POA: Diagnosis not present

## 2015-12-14 DIAGNOSIS — E291 Testicular hypofunction: Secondary | ICD-10-CM | POA: Diagnosis not present

## 2015-12-14 DIAGNOSIS — Z8546 Personal history of malignant neoplasm of prostate: Secondary | ICD-10-CM | POA: Diagnosis not present

## 2016-03-13 DIAGNOSIS — M5380 Other specified dorsopathies, site unspecified: Secondary | ICD-10-CM | POA: Diagnosis not present

## 2016-03-13 DIAGNOSIS — I10 Essential (primary) hypertension: Secondary | ICD-10-CM | POA: Diagnosis not present

## 2016-03-13 DIAGNOSIS — G4733 Obstructive sleep apnea (adult) (pediatric): Secondary | ICD-10-CM | POA: Diagnosis not present

## 2016-03-13 DIAGNOSIS — C61 Malignant neoplasm of prostate: Secondary | ICD-10-CM | POA: Diagnosis not present

## 2016-05-20 DIAGNOSIS — Z8546 Personal history of malignant neoplasm of prostate: Secondary | ICD-10-CM | POA: Diagnosis not present

## 2016-05-26 DIAGNOSIS — E291 Testicular hypofunction: Secondary | ICD-10-CM | POA: Diagnosis not present

## 2016-05-26 DIAGNOSIS — Z8546 Personal history of malignant neoplasm of prostate: Secondary | ICD-10-CM | POA: Diagnosis not present

## 2016-07-23 ENCOUNTER — Encounter: Payer: Self-pay | Admitting: Gastroenterology

## 2016-08-16 DIAGNOSIS — J40 Bronchitis, not specified as acute or chronic: Secondary | ICD-10-CM | POA: Diagnosis not present

## 2016-08-16 DIAGNOSIS — B9789 Other viral agents as the cause of diseases classified elsewhere: Secondary | ICD-10-CM | POA: Diagnosis not present

## 2016-08-16 DIAGNOSIS — J069 Acute upper respiratory infection, unspecified: Secondary | ICD-10-CM | POA: Diagnosis not present

## 2016-08-16 DIAGNOSIS — R509 Fever, unspecified: Secondary | ICD-10-CM | POA: Diagnosis not present

## 2016-08-19 DIAGNOSIS — H5203 Hypermetropia, bilateral: Secondary | ICD-10-CM | POA: Diagnosis not present

## 2016-08-19 DIAGNOSIS — H524 Presbyopia: Secondary | ICD-10-CM | POA: Diagnosis not present

## 2016-09-11 DIAGNOSIS — E784 Other hyperlipidemia: Secondary | ICD-10-CM | POA: Diagnosis not present

## 2016-09-11 DIAGNOSIS — Z125 Encounter for screening for malignant neoplasm of prostate: Secondary | ICD-10-CM | POA: Diagnosis not present

## 2016-09-11 DIAGNOSIS — I10 Essential (primary) hypertension: Secondary | ICD-10-CM | POA: Diagnosis not present

## 2016-09-11 DIAGNOSIS — E291 Testicular hypofunction: Secondary | ICD-10-CM | POA: Diagnosis not present

## 2016-09-18 DIAGNOSIS — Z1389 Encounter for screening for other disorder: Secondary | ICD-10-CM | POA: Diagnosis not present

## 2016-09-18 DIAGNOSIS — Z0000 Encounter for general adult medical examination without abnormal findings: Secondary | ICD-10-CM | POA: Diagnosis not present

## 2016-09-18 DIAGNOSIS — C61 Malignant neoplasm of prostate: Secondary | ICD-10-CM | POA: Diagnosis not present

## 2016-09-18 DIAGNOSIS — Z1212 Encounter for screening for malignant neoplasm of rectum: Secondary | ICD-10-CM | POA: Diagnosis not present

## 2016-09-18 DIAGNOSIS — D126 Benign neoplasm of colon, unspecified: Secondary | ICD-10-CM | POA: Diagnosis not present

## 2016-09-18 DIAGNOSIS — M5380 Other specified dorsopathies, site unspecified: Secondary | ICD-10-CM | POA: Diagnosis not present

## 2016-09-18 DIAGNOSIS — E291 Testicular hypofunction: Secondary | ICD-10-CM | POA: Diagnosis not present

## 2016-12-23 DIAGNOSIS — M5380 Other specified dorsopathies, site unspecified: Secondary | ICD-10-CM | POA: Diagnosis not present

## 2016-12-23 DIAGNOSIS — Z6833 Body mass index (BMI) 33.0-33.9, adult: Secondary | ICD-10-CM | POA: Diagnosis not present

## 2016-12-23 DIAGNOSIS — Z79899 Other long term (current) drug therapy: Secondary | ICD-10-CM | POA: Diagnosis not present

## 2017-03-24 DIAGNOSIS — I10 Essential (primary) hypertension: Secondary | ICD-10-CM | POA: Diagnosis not present

## 2017-03-24 DIAGNOSIS — D126 Benign neoplasm of colon, unspecified: Secondary | ICD-10-CM | POA: Diagnosis not present

## 2017-03-24 DIAGNOSIS — Z79899 Other long term (current) drug therapy: Secondary | ICD-10-CM | POA: Diagnosis not present

## 2017-03-24 DIAGNOSIS — M5380 Other specified dorsopathies, site unspecified: Secondary | ICD-10-CM | POA: Diagnosis not present

## 2017-04-20 ENCOUNTER — Encounter: Payer: Self-pay | Admitting: Gastroenterology

## 2017-04-29 DIAGNOSIS — E291 Testicular hypofunction: Secondary | ICD-10-CM | POA: Diagnosis not present

## 2017-04-29 DIAGNOSIS — Z8546 Personal history of malignant neoplasm of prostate: Secondary | ICD-10-CM | POA: Diagnosis not present

## 2017-05-08 DIAGNOSIS — E291 Testicular hypofunction: Secondary | ICD-10-CM | POA: Diagnosis not present

## 2017-05-08 DIAGNOSIS — Z8546 Personal history of malignant neoplasm of prostate: Secondary | ICD-10-CM | POA: Diagnosis not present

## 2017-06-12 ENCOUNTER — Other Ambulatory Visit: Payer: Self-pay

## 2017-06-12 ENCOUNTER — Ambulatory Visit (AMBULATORY_SURGERY_CENTER): Payer: Self-pay | Admitting: *Deleted

## 2017-06-12 VITALS — Ht 73.0 in | Wt 248.0 lb

## 2017-06-12 DIAGNOSIS — Z86010 Personal history of colonic polyps: Secondary | ICD-10-CM

## 2017-06-12 MED ORDER — PEG-KCL-NACL-NASULF-NA ASC-C 140 G PO SOLR: 1.0000 | ORAL | 0 refills | Status: DC

## 2017-06-12 NOTE — Progress Notes (Signed)
No egg or soy allergy known to patient  No issues with past sedation with any surgeries  or procedures, no intubation problems  No diet pills per patient No home 02 use per patient  No blood thinners per patient  Pt denies issues with constipation  No A fib or A flutter but has palpitations  EMMI video sent to pt's e mail - pt declined Pay no more 50 coupon- to pt - we activated PLENVU COUPON in Pv today - explained now pharmacy has to run as secondary and it should drop to the $50 - call with issues

## 2017-06-26 ENCOUNTER — Ambulatory Visit (AMBULATORY_SURGERY_CENTER): Payer: BLUE CROSS/BLUE SHIELD | Admitting: Gastroenterology

## 2017-06-26 ENCOUNTER — Encounter: Payer: Self-pay | Admitting: Gastroenterology

## 2017-06-26 VITALS — BP 119/69 | HR 61 | Temp 96.9°F | Resp 14 | Ht 73.0 in | Wt 248.0 lb

## 2017-06-26 DIAGNOSIS — Z86010 Personal history of colonic polyps: Secondary | ICD-10-CM | POA: Diagnosis present

## 2017-06-26 DIAGNOSIS — D120 Benign neoplasm of cecum: Secondary | ICD-10-CM

## 2017-06-26 DIAGNOSIS — D123 Benign neoplasm of transverse colon: Secondary | ICD-10-CM

## 2017-06-26 HISTORY — PX: COLONOSCOPY: SHX174

## 2017-06-26 MED ORDER — SODIUM CHLORIDE 0.9 % IV SOLN: 500.0000 mL | Freq: Once | INTRAVENOUS | Status: DC

## 2017-06-26 NOTE — Op Note (Signed)
[place] Patient Name: Keith Stone Procedure Date: 06/26/2017 9:26 AM MRN: [id number] Endoscopist: Ladene Artist , MD Age: 57 Referring MD:  Date of Birth: 1961/01/25 Gender: Male Account #: [id number] Procedure:                Colonoscopy Indications:              Surveillance: Personal history of adenomatous                            polyps on last colonoscopy > 5 years ago Medicines:                Monitored Anesthesia Care Procedure:                Pre-Anesthesia Assessment:                           - Prior to the procedure, a History and Physical                            was performed, and patient medications and                            allergies were reviewed. The patient's tolerance of                            previous anesthesia was also reviewed. The risks                            and benefits of the procedure and the sedation                            options and risks were discussed with the patient.                            All questions were answered, and informed consent                            was obtained. Prior Anticoagulants: The patient has                            taken no previous anticoagulant or antiplatelet                            agents. ASA Grade Assessment: II - A patient with                            mild systemic disease. After reviewing the risks                            and benefits, the patient was deemed in                            satisfactory condition to undergo the procedure.  After obtaining informed consent, the colonoscope                            was passed under direct vision. Throughout the                            procedure, the patient's blood pressure, pulse, and                            oxygen saturations were monitored continuously. The                            Colonoscope was introduced through the anus and                            advanced to the the  cecum, identified by                            appendiceal orifice and ileocecal valve. The                            ileocecal valve, appendiceal orifice, and rectum                            were photographed. The quality of the bowel                            preparation was excellent. The colonoscopy was                            performed without difficulty. The patient tolerated                            the procedure well. Scope In: 9:30:30 AM Scope Out: 9:48:12 AM Scope Withdrawal Time: 0 hours 15 minutes 30 seconds  Total Procedure Duration: 0 hours 17 minutes 42 seconds  Findings:                 The perianal and digital rectal examinations were                            normal.                           Three sessile polyps were found in the transverse                            colon (2) and cecum (1). The polyps were 6 to 7 mm                            in size. These polyps were removed with a cold                            snare. Resection and retrieval were complete.  Two sessile polyps were found in the transverse                            colon. The polyps were 4 to 5 mm in size. These                            polyps were removed with a cold biopsy forceps.                            Resection and retrieval were complete.                           Many small-mouthed diverticula were found in the                            left colon. There was evidence of diverticular                            spasm. There was no evidence of diverticular                            bleeding.                           The exam was otherwise without abnormality on                            direct and retroflexion views. Complications:            No immediate complications. Estimated blood loss:                            None. Estimated Blood Loss:     Estimated blood loss: none. Impression:               - Three 6 to 7 mm polyps in the transverse  colon                            and in the cecum, removed with a cold snare.                            Resected and retrieved.                           - Two 4 to 5 mm polyps in the transverse colon,                            removed with a cold biopsy forceps. Resected and                            retrieved.                           - Mild diverticulosis in the left colon. There was  evidence of diverticular spasm. There was no                            evidence of diverticular bleeding.                           - The examination was otherwise normal on direct                            and retroflexion views. Recommendation:           - Repeat colonoscopy in 3 - 5 years for                            surveillance.                           - Patient has a contact number available for                            emergencies. The signs and symptoms of potential                            delayed complications were discussed with the                            patient. Return to normal activities tomorrow.                            Written discharge instructions were provided to the                            patient.                           - High fiber diet.                           - Continue present medications.                           - Await pathology results. Ladene Artist, MD 06/26/2017 9:58:16 AM This report has been signed electronically.

## 2017-06-26 NOTE — Patient Instructions (Signed)
YOU HAD AN ENDOSCOPIC PROCEDURE TODAY AT THE [facility]:   Refer to the procedure report that was given to you for any specific questions about what was found during the examination.  If the procedure report does not answer your questions, please call your gastroenterologist to clarify.  If you requested that your care partner not be given the details of your procedure findings, then the procedure report has been included in a sealed envelope for you to review at your convenience later.  YOU SHOULD EXPECT: Some feelings of bloating in the abdomen. Passage of more gas than usual.  Walking can help get rid of the air that was put into your GI tract during the procedure and reduce the bloating. If you had a lower endoscopy (such as a colonoscopy or flexible sigmoidoscopy) you may notice spotting of blood in your stool or on the toilet paper. If you underwent a bowel prep for your procedure, you may not have a normal bowel movement for a few days.  Please Note:  You might notice some irritation and congestion in your nose or some drainage.  This is from the oxygen used during your procedure.  There is no need for concern and it should clear up in a day or so.  SYMPTOMS TO REPORT IMMEDIATELY:   Following lower endoscopy (colonoscopy or flexible sigmoidoscopy):  Excessive amounts of blood in the stool  Significant tenderness or worsening of abdominal pains  Swelling of the abdomen that is new, acute  Fever of 100F or higher    For urgent or emergent issues, a gastroenterologist can be reached at any hour by calling [phone].   DIET:  We do recommend a small meal at first, but then you may proceed to your regular diet.  Drink plenty of fluids but you should avoid alcoholic beverages for 24 hours.  ACTIVITY:  You should plan to take it easy for the rest of today and you should NOT DRIVE or use heavy machinery until tomorrow (because of the sedation medicines used during the test).     FOLLOW UP: Our staff will call the number listed on your records the next business day following your procedure to check on you and address any questions or concerns that you may have regarding the information given to you following your procedure. If we do not reach you, we will leave a message.  However, if you are feeling well and you are not experiencing any problems, there is no need to return our call.  We will assume that you have returned to your regular daily activities without incident.  If any biopsies were taken you will be contacted by phone or by letter within the next 1-3 weeks.  Please call us at [phone] if you have not heard about the biopsies in 3 weeks.    SIGNATURES/CONFIDENTIALITY: You and/or your care partner have signed paperwork which will be entered into your electronic medical record.  These signatures attest to the fact that that the information above on your After Visit Summary has been reviewed and is understood.  Full responsibility of the confidentiality of this discharge information lies with you and/or your care-partner.    Handouts were given to your care partner on polyps, diverticulosis, and a high fiber diet with liberal fluid intake. You may resume your current medications today. Await biopsy results. Please call if any questions or concerns.   

## 2017-06-26 NOTE — Progress Notes (Signed)
No problems noted in the recovery room. maw 

## 2017-06-26 NOTE — Progress Notes (Signed)
Called to room to assist during endoscopic procedure.  Patient ID and intended procedure confirmed with present staff. Received instructions for my participation in the procedure from the performing physician.  

## 2017-06-26 NOTE — Progress Notes (Signed)
Report to PACU, RN, vss, BBS= Clear.  

## 2017-06-29 ENCOUNTER — Telehealth: Payer: Self-pay

## 2017-06-29 NOTE — Telephone Encounter (Signed)
  Follow up Call-  Call back number 06/26/2017  Post procedure Call Back phone  # [phone]  Permission to leave phone message Yes  Some recent data might be hidden     Patient questions:  Do you have a fever, pain , or abdominal swelling? No. Pain Score  0 *  Have you tolerated food without any problems? Yes.    Have you been able to return to your normal activities? Yes.    Do you have any questions about your discharge instructions: Diet    Medications  No. Follow up visit  No.  Do you have questions or concerns about your Care? No.  Actions: * If pain score is 4 or above: No action needed, pain <4.

## 2017-06-30 ENCOUNTER — Encounter: Payer: Self-pay | Admitting: Gastroenterology

## 2017-06-30 DIAGNOSIS — Z79899 Other long term (current) drug therapy: Secondary | ICD-10-CM | POA: Diagnosis not present

## 2017-06-30 DIAGNOSIS — M5380 Other specified dorsopathies, site unspecified: Secondary | ICD-10-CM | POA: Diagnosis not present

## 2017-06-30 DIAGNOSIS — I10 Essential (primary) hypertension: Secondary | ICD-10-CM | POA: Diagnosis not present

## 2017-08-18 DIAGNOSIS — I10 Essential (primary) hypertension: Secondary | ICD-10-CM | POA: Diagnosis not present

## 2017-08-18 DIAGNOSIS — Z6831 Body mass index (BMI) 31.0-31.9, adult: Secondary | ICD-10-CM | POA: Diagnosis not present

## 2017-08-18 DIAGNOSIS — R05 Cough: Secondary | ICD-10-CM | POA: Diagnosis not present

## 2017-08-18 DIAGNOSIS — J209 Acute bronchitis, unspecified: Secondary | ICD-10-CM | POA: Diagnosis not present

## 2017-09-29 DIAGNOSIS — Z0000 Encounter for general adult medical examination without abnormal findings: Secondary | ICD-10-CM | POA: Diagnosis not present

## 2017-09-29 DIAGNOSIS — I10 Essential (primary) hypertension: Secondary | ICD-10-CM | POA: Diagnosis not present

## 2017-09-29 DIAGNOSIS — Z125 Encounter for screening for malignant neoplasm of prostate: Secondary | ICD-10-CM | POA: Diagnosis not present

## 2017-09-29 DIAGNOSIS — E291 Testicular hypofunction: Secondary | ICD-10-CM | POA: Diagnosis not present

## 2017-09-29 DIAGNOSIS — R82998 Other abnormal findings in urine: Secondary | ICD-10-CM | POA: Diagnosis not present

## 2017-10-06 DIAGNOSIS — I10 Essential (primary) hypertension: Secondary | ICD-10-CM | POA: Diagnosis not present

## 2017-10-06 DIAGNOSIS — E7849 Other hyperlipidemia: Secondary | ICD-10-CM | POA: Diagnosis not present

## 2017-10-06 DIAGNOSIS — Z0000 Encounter for general adult medical examination without abnormal findings: Secondary | ICD-10-CM | POA: Diagnosis not present

## 2017-10-06 DIAGNOSIS — C61 Malignant neoplasm of prostate: Secondary | ICD-10-CM | POA: Diagnosis not present

## 2017-10-06 DIAGNOSIS — G4733 Obstructive sleep apnea (adult) (pediatric): Secondary | ICD-10-CM | POA: Diagnosis not present

## 2017-10-06 DIAGNOSIS — Z1389 Encounter for screening for other disorder: Secondary | ICD-10-CM | POA: Diagnosis not present

## 2017-10-13 DIAGNOSIS — Z1212 Encounter for screening for malignant neoplasm of rectum: Secondary | ICD-10-CM | POA: Diagnosis not present

## 2017-10-19 DIAGNOSIS — J029 Acute pharyngitis, unspecified: Secondary | ICD-10-CM | POA: Diagnosis not present

## 2017-10-19 DIAGNOSIS — J22 Unspecified acute lower respiratory infection: Secondary | ICD-10-CM | POA: Diagnosis not present

## 2017-10-20 DIAGNOSIS — H5203 Hypermetropia, bilateral: Secondary | ICD-10-CM | POA: Diagnosis not present

## 2017-10-20 DIAGNOSIS — H524 Presbyopia: Secondary | ICD-10-CM | POA: Diagnosis not present

## 2017-10-28 DIAGNOSIS — L738 Other specified follicular disorders: Secondary | ICD-10-CM | POA: Diagnosis not present

## 2017-10-28 DIAGNOSIS — L821 Other seborrheic keratosis: Secondary | ICD-10-CM | POA: Diagnosis not present

## 2017-10-28 DIAGNOSIS — D225 Melanocytic nevi of trunk: Secondary | ICD-10-CM | POA: Diagnosis not present

## 2017-10-28 DIAGNOSIS — D2362 Other benign neoplasm of skin of left upper limb, including shoulder: Secondary | ICD-10-CM | POA: Diagnosis not present

## 2018-01-05 DIAGNOSIS — I10 Essential (primary) hypertension: Secondary | ICD-10-CM | POA: Diagnosis not present

## 2018-01-05 DIAGNOSIS — Z79899 Other long term (current) drug therapy: Secondary | ICD-10-CM | POA: Diagnosis not present

## 2018-01-05 DIAGNOSIS — M5380 Other specified dorsopathies, site unspecified: Secondary | ICD-10-CM | POA: Diagnosis not present

## 2018-01-05 DIAGNOSIS — Z6833 Body mass index (BMI) 33.0-33.9, adult: Secondary | ICD-10-CM | POA: Diagnosis not present

## 2018-04-06 DIAGNOSIS — I10 Essential (primary) hypertension: Secondary | ICD-10-CM | POA: Diagnosis not present

## 2018-04-06 DIAGNOSIS — M25559 Pain in unspecified hip: Secondary | ICD-10-CM | POA: Diagnosis not present

## 2018-04-06 DIAGNOSIS — M5380 Other specified dorsopathies, site unspecified: Secondary | ICD-10-CM | POA: Diagnosis not present

## 2018-04-06 DIAGNOSIS — G4733 Obstructive sleep apnea (adult) (pediatric): Secondary | ICD-10-CM | POA: Diagnosis not present

## 2018-05-11 DIAGNOSIS — L814 Other melanin hyperpigmentation: Secondary | ICD-10-CM | POA: Diagnosis not present

## 2018-07-06 DIAGNOSIS — Z6834 Body mass index (BMI) 34.0-34.9, adult: Secondary | ICD-10-CM | POA: Diagnosis not present

## 2018-07-06 DIAGNOSIS — I10 Essential (primary) hypertension: Secondary | ICD-10-CM | POA: Diagnosis not present

## 2018-07-06 DIAGNOSIS — G8929 Other chronic pain: Secondary | ICD-10-CM | POA: Diagnosis not present

## 2018-07-06 DIAGNOSIS — M5380 Other specified dorsopathies, site unspecified: Secondary | ICD-10-CM | POA: Diagnosis not present

## 2018-10-05 DIAGNOSIS — Z125 Encounter for screening for malignant neoplasm of prostate: Secondary | ICD-10-CM | POA: Diagnosis not present

## 2018-10-05 DIAGNOSIS — R82998 Other abnormal findings in urine: Secondary | ICD-10-CM | POA: Diagnosis not present

## 2018-10-05 DIAGNOSIS — I10 Essential (primary) hypertension: Secondary | ICD-10-CM | POA: Diagnosis not present

## 2018-10-05 DIAGNOSIS — E291 Testicular hypofunction: Secondary | ICD-10-CM | POA: Diagnosis not present

## 2018-10-05 DIAGNOSIS — E7849 Other hyperlipidemia: Secondary | ICD-10-CM | POA: Diagnosis not present

## 2018-10-12 DIAGNOSIS — C61 Malignant neoplasm of prostate: Secondary | ICD-10-CM | POA: Diagnosis not present

## 2018-10-12 DIAGNOSIS — Z0000 Encounter for general adult medical examination without abnormal findings: Secondary | ICD-10-CM | POA: Diagnosis not present

## 2018-10-12 DIAGNOSIS — I10 Essential (primary) hypertension: Secondary | ICD-10-CM | POA: Diagnosis not present

## 2018-10-12 DIAGNOSIS — Z1331 Encounter for screening for depression: Secondary | ICD-10-CM | POA: Diagnosis not present

## 2018-10-12 DIAGNOSIS — E785 Hyperlipidemia, unspecified: Secondary | ICD-10-CM | POA: Diagnosis not present

## 2018-10-12 DIAGNOSIS — G4733 Obstructive sleep apnea (adult) (pediatric): Secondary | ICD-10-CM | POA: Diagnosis not present

## 2018-11-25 DIAGNOSIS — C44319 Basal cell carcinoma of skin of other parts of face: Secondary | ICD-10-CM | POA: Diagnosis not present

## 2018-11-25 DIAGNOSIS — L859 Epidermal thickening, unspecified: Secondary | ICD-10-CM | POA: Diagnosis not present

## 2018-11-25 DIAGNOSIS — D485 Neoplasm of uncertain behavior of skin: Secondary | ICD-10-CM | POA: Diagnosis not present

## 2018-11-25 DIAGNOSIS — D225 Melanocytic nevi of trunk: Secondary | ICD-10-CM | POA: Diagnosis not present

## 2019-01-11 DIAGNOSIS — C44319 Basal cell carcinoma of skin of other parts of face: Secondary | ICD-10-CM | POA: Diagnosis not present

## 2019-01-11 DIAGNOSIS — G8929 Other chronic pain: Secondary | ICD-10-CM | POA: Diagnosis not present

## 2019-01-11 DIAGNOSIS — C61 Malignant neoplasm of prostate: Secondary | ICD-10-CM | POA: Diagnosis not present

## 2019-01-11 DIAGNOSIS — M5380 Other specified dorsopathies, site unspecified: Secondary | ICD-10-CM | POA: Diagnosis not present

## 2019-01-18 DIAGNOSIS — Z8546 Personal history of malignant neoplasm of prostate: Secondary | ICD-10-CM | POA: Diagnosis not present

## 2019-02-22 DIAGNOSIS — Z8546 Personal history of malignant neoplasm of prostate: Secondary | ICD-10-CM | POA: Diagnosis not present

## 2019-02-22 DIAGNOSIS — C61 Malignant neoplasm of prostate: Secondary | ICD-10-CM

## 2019-02-22 DIAGNOSIS — R9721 Rising PSA following treatment for malignant neoplasm of prostate: Secondary | ICD-10-CM

## 2019-02-22 HISTORY — DX: Malignant neoplasm of prostate: C61

## 2019-02-22 HISTORY — DX: Rising PSA following treatment for malignant neoplasm of prostate: R97.21

## 2019-03-01 DIAGNOSIS — C61 Malignant neoplasm of prostate: Secondary | ICD-10-CM | POA: Diagnosis not present

## 2019-03-03 ENCOUNTER — Encounter: Payer: Self-pay | Admitting: *Deleted

## 2019-03-07 ENCOUNTER — Encounter: Payer: Self-pay | Admitting: Radiation Oncology

## 2019-03-07 NOTE — Progress Notes (Signed)
GU Location of Tumor / Histology: biochemical recurrent prostatic adenocarcinoma  If Prostate Cancer, Gleason Score is (3 + 4) and PSA is (5.5) pre treatment. Prostate volume: 22 grams.  Keith Stone had a radical prostatectomy on 02/23/2014. Unfortunately, his PSA has begun to slowly rise.    Past/Anticipated interventions by urology, if any: prostate biopsy, prostatectomy, surveillance, referral for consideration of salvage radiotherapy  Past/Anticipated interventions by medical oncology, if any: no  Weight changes, if any: no  Bowel/Bladder complaints, if any: Reports nocturia. Endorses full erections. Denies wearing having to wear a pad. Denies dysuria or hematuria. Denies urinary incontinence or leakage.   Nausea/Vomiting, if any: no  Pain issues, if any:  no  SAFETY ISSUES:  Prior radiation? no  Pacemaker/ICD? no  Possible current pregnancy? no, male patient  Is the patient on methotrexate? no  Current Complaints / other details:  58 year old male.

## 2019-03-08 ENCOUNTER — Other Ambulatory Visit: Payer: Self-pay

## 2019-03-08 ENCOUNTER — Ambulatory Visit
Admission: RE | Admit: 2019-03-08 | Discharge: 2019-03-08 | Disposition: A | Discharge status: Home / Self Care | Payer: BC Managed Care – PPO | Source: Ambulatory Visit | Attending: Radiation Oncology | Admitting: Radiation Oncology

## 2019-03-08 ENCOUNTER — Encounter: Payer: Self-pay | Admitting: Medical Oncology

## 2019-03-08 ENCOUNTER — Encounter: Payer: Self-pay | Admitting: Radiation Oncology

## 2019-03-08 VITALS — Ht 73.0 in | Wt 236.0 lb

## 2019-03-08 DIAGNOSIS — C61 Malignant neoplasm of prostate: Secondary | ICD-10-CM

## 2019-03-08 DIAGNOSIS — R9721 Rising PSA following treatment for malignant neoplasm of prostate: Secondary | ICD-10-CM | POA: Diagnosis not present

## 2019-03-08 DIAGNOSIS — Z9889 Other specified postprocedural states: Secondary | ICD-10-CM | POA: Diagnosis not present

## 2019-03-08 HISTORY — DX: Malignant neoplasm of prostate: C61

## 2019-03-08 NOTE — Progress Notes (Signed)
See progress note under physician encounter. 

## 2019-03-08 NOTE — Progress Notes (Signed)
Radiation Oncology         [phone] ________________________________  Initial outpatient Consultation - Conducted via Telephone due to current COVID-19 concerns for limiting patient exposure  Name: Keith Stone MRN: [id number]  Date: 03/08/2019  DOB: [birth year]  [id number], Ravisankar, MD  Franchot Gallo, MD   REFERRING PHYSICIAN: Franchot Gallo, MD  DIAGNOSIS: 58 y.o. gentleman with biochemical recurrence of prostate cancer s/p RALP 01/2014 for Stage pT3a, pN0 Gleason 3+4 adenocarcinoma of the prostate.    ICD-10-CM   1. Prostate cancer [facility])  C61     HISTORY OF PRESENT ILLNESS: ANTAWON Stone is a 58 y.o. male with a rising, detectable PSA s/p nerve-sparing radical prostatectomy in September 2015 with Dr. Risa Grill for [place] Gleason 3+4 adenocarcinoma of the prostate. His PSA was 5.5 at the time of diagnosis.  Final surgical pathology confirmed pT2cN0, Gleason 3+4 prostatic adenocarcinoma involving both lobes with a focally positive margin extending to the right posterior; without evidence of angiolymphatic invasion, extraprostatic extension or lymph node involvement.  His initial postoperative PSA was undetectable and remained undetectable until 04/2017 when it was 0.019.  His PSA has been gradually rising since that time with a PSA of 0.14 in April 2019, 0.051 in April 2020, 0.069 in July 2020 and his most recent PSA on 02/24/2019 at 0.091.  He has regained full bladder continence and denies any issues with erectile dysfunction since the time of his procedure.  The patient reviewed his surgical pathology and recent PSA results with his urologist and he has kindly been referred today for discussion of potential salvage radiation treatment options.   PREVIOUS RADIATION THERAPY: No  PAST MEDICAL HISTORY:  Past Medical History:  Diagnosis Date   Adenomatous colon polyp 08/26/2011   HYPERPLASTIC POLYP (MULTIPLE FRAGMENTS AND TUBULAR ADENOMA (ONE FRAGMENT)   Allergic  rhinitis    Allergy    Asthma    as child, none now   Complication of anesthesia 20 years ago   woke up with mouth oral surgery   DJD (degenerative joint disease)    GERD (gastroesophageal reflux disease)    occ uses alka seltzer   Hypertension    Insomnia    Obstructive sleep apnea    on CPAP setting of 8 uses once or twice a month   Palpitations    Prostate cancer ([place])    Sleep apnea    no cpap       PAST SURGICAL HISTORY: Past Surgical History:  Procedure Laterality Date   BILATERAL KNEE ARTHROSCOPY  [id number]   COLONOSCOPY     LYMPHADENECTOMY Bilateral 02/22/2014   Procedure: PELVIC LYMPH NODE DISSECTION;  Surgeon: Bernestine Amass, MD;  Location: [facility];  Service: Urology;  Laterality: Bilateral;   MOUTH SURGERY  20 years ago   POLYPECTOMY     PROSTATE BIOPSY  November 22, 2013   ROBOT ASSISTED LAPAROSCOPIC RADICAL PROSTATECTOMY N/A 02/22/2014   Procedure: ROBOTIC ASSISTED LAPAROSCOPIC RADICAL PROSTATECTOMY;  Surgeon: Bernestine Amass, MD;  Location: [facility];  Service: Urology;  Laterality: N/A;   TONSILLECTOMY  as child   TYMPANOSTOMY TUBE PLACEMENT     several times    VASECTOMY  2000    FAMILY HISTORY:  Family History  Problem Relation Age of Onset   Arthritis Mother    Fibromyalgia Mother    Coronary artery disease Maternal Grandmother    Coronary artery disease Maternal Grandfather    Cancer Maternal Grandfather    Hypertension Brother    Colon polyps  Brother    Skin cancer Brother    Colon polyps Father    Lung cancer Father    Lymphoma Paternal Uncle    Colon cancer Neg Hx    Esophageal cancer Neg Hx    Stomach cancer Neg Hx    Rectal cancer Neg Hx     SOCIAL HISTORY:  Social History   Socioeconomic History   Marital status: Married    Spouse name: Not on file   Number of children: 3   Years of education: Not on file   Highest education level: Not on file  Occupational History   Occupation: Press photographer Rep at [employer] resource strain: Not on file   Food insecurity    Worry: Not on file    Inability: Not on file   Transportation needs    Medical: Not on file    Non-medical: Not on file  Tobacco Use   Smoking status: Current Every Day Smoker    Packs/day: 1.00    Years: 30.00    Pack years: 30.00    Types: E-cigarettes   Smokeless tobacco: Never Used   Tobacco comment: quit cigarettes 2 years ago 1 and 1/2 ppd x 30 years, now e cigarette  Substance and Sexual Activity   Alcohol use: Yes    Comment: social only   Drug use: No   Sexual activity: Yes  Lifestyle   Physical activity    Days per week: Not on file    Minutes per session: Not on file   Stress: Not on file  Relationships   Social connections    Talks on phone: Not on file    Gets together: Not on file    Attends religious service: Not on file    Active member of club or organization: Not on file    Attends meetings of clubs or organizations: Not on file    Relationship status: Not on file   Intimate partner violence    Fear of current or ex partner: Not on file    Emotionally abused: Not on file    Physically abused: Not on file    Forced sexual activity: Not on file  Other Topics Concern   Not on file  Social History Narrative   Not on file    ALLERGIES: Patient has no known allergies.  MEDICATIONS:  Current Outpatient Medications  Medication Sig Dispense Refill   carvedilol (COREG) 12.5 MG tablet Take 12.5 mg by mouth 2 (two) times daily with a meal.     felodipine (PLENDIL) 10 MG 24 hr tablet Take 10 mg by mouth daily.     HYDROcodone-acetaminophen (NORCO/VICODIN) 5-325 MG per tablet Take 1-2 tablets by mouth every 6 (six) hours as needed for moderate pain. 30 tablet 0   losartan-hydrochlorothiazide (HYZAAR) 100-25 MG per tablet Take 0.5 tablets by mouth 2 (two) times daily.      zolpidem (AMBIEN) 10 MG tablet Take 10 mg by mouth at bedtime as needed.      Current Facility-Administered Medications  Medication Dose Route Frequency Provider Last Rate Last Dose   0.9 %  sodium chloride infusion  500 mL Intravenous Once Ladene Artist, MD        REVIEW OF SYSTEMS:  On review of systems, the patient reports that he is doing well overall. He denies any chest pain, shortness of breath, cough, fevers, chills, night sweats, unintended weight changes. He denies any bowel disturbances, and denies abdominal  pain, nausea or vomiting. He denies any new musculoskeletal or joint aches or pains. He reports nocturia. He denies urinary incontinence or leakage, dysuria or hematuria, pain, and nausea or vomiting. He reports no issues with erectile function. A complete review of systems is obtained and is otherwise negative.    PHYSICAL EXAM:  Wt Readings from Last 3 Encounters:  03/08/19 236 lb (107 kg)  06/26/17 248 lb (112.5 kg)  06/12/17 248 lb (112.5 kg)   Temp Readings from Last 3 Encounters:  06/26/17 (!) 96.9 F (36.1 C)  02/23/14 98.2 F (36.8 C) (Oral)  02/16/14 98.5 F (36.9 C) (Oral)   BP Readings from Last 3 Encounters:  06/26/17 119/69  02/23/14 (!) 114/59  02/16/14 120/77   Pulse Readings from Last 3 Encounters:  06/26/17 61  02/23/14 80  02/16/14 73   Pain Assessment Pain Score: 0-No pain/10  Unable to assess due to telephone consult visit format.  KPS = 100  100 - Normal; no complaints; no evidence of disease. 90   - Able to carry on normal activity; minor signs or symptoms of disease. 80   - Normal activity with effort; some signs or symptoms of disease. 66   - Cares for self; unable to carry on normal activity or to do active work. 60   - Requires occasional assistance, but is able to care for most of his personal needs. 50   - Requires considerable assistance and frequent medical care. 57   - Disabled; requires special care and assistance. 33   - Severely disabled; hospital admission is indicated although death not  imminent. 24   - Very sick; hospital admission necessary; active supportive treatment necessary. 10   - Moribund; fatal processes progressing rapidly. 0     - Dead  Karnofsky DA, Abelmann Mancos, Craver LS and Burchenal [facility] [phone]) The use of the nitrogen mustards in the palliative treatment of carcinoma: with particular reference to bronchogenic carcinoma Cancer 1 [id number]  LABORATORY DATA:  Lab Results  Component Value Date   WBC 6.1 02/16/2014   HGB 11.4 (L) 02/23/2014   HCT 33.9 (L) 02/23/2014   MCV 85.0 02/16/2014   PLT 238 02/16/2014   Lab Results  Component Value Date   NA 140 02/23/2014   K 3.3 (L) 02/23/2014   CL 101 02/23/2014   CO2 30 02/23/2014   No results found for: ALT, AST, GGT, ALKPHOS, BILITOT   RADIOGRAPHY: No results found.    IMPRESSION/PLAN:  This visit was conducted via Telephone to spare the patient unnecessary potential exposure in the healthcare setting during the current COVID-19 pandemic.  1. 58 y.o. gentleman with biochemical recurrence of prostate cancer s/p RALP 01/2014 for Stage pT3a, pN0 Gleason 3+4 adenocarcinoma of the prostate. Today we reviewed the findings and workup thus far.  We discussed the natural history of prostate cancer.  We reviewed the the implications of positive margins, extracapsular extension, and seminal vesicle involvement on the risk of prostate cancer recurrence, particularly in the setting of having a rising, detectable PSA postoperatively. We discussed radiation treatment directed to the prostatic fossa with regard to the logistics and delivery of external beam radiation treatment.  We reviewed the risks and benefits associated with radiotherapy in this setting as well as short and long-term side effects to be expected.  He was encouraged to ask questions were answered to his stated satisfaction.  At the end of the conversation the patient is interested in moving forward with a 7.5 week course  of salvage external beam therapy to the  prostate fossa. We will share our discussion with Dr. Louis Meckel and move forward with treatment planning. He is scheduled for CT simulation on Friday, 03/11/2019, at 3 pm in anticipation of beginning his radiation treatments in the near future.  He appears to have a good understanding of his disease and our recommendations which are of curative intent.  He is comfortable with and in agreement with the stated plan.  Given current concerns for patient exposure during the COVID-19 pandemic, this encounter was conducted via telephone. The patient was notified in advance and was offered a MyChart meeting to allow for face to face communication but unfortunately reported that he did not have the appropriate resources/technology to support such a visit and instead preferred to proceed with telephone consult. The patient has given verbal consent for this type of encounter. The time spent during this encounter was 50 minutes. The attendants for this meeting include Tyler Pita MD, Ashlyn Bruning PA-C, [place], and patient. Ilda Basset. During the encounter, Tyler Pita MD, Ashlyn Bruning PA-C, and scribe, Wilburn Mylar were located at [place].  Patient, Natrone Mcquistion was located at home.    Nicholos Johns, PA-C    Tyler Pita, MD  [facility] Direct Dial: [phone]   Fax: [phone] [email or web address]   Skype   [email or web address]  This document serves as a record of services personally performed by Tyler Pita, MD and Freeman Caldron, PA-C. It was created on their behalf by Wilburn Mylar, a trained medical scribe. The creation of this record is based on the scribe's personal observations and the provider's statements to them. This document has been checked and approved by the attending provider.

## 2019-03-11 ENCOUNTER — Encounter: Payer: Self-pay | Admitting: Medical Oncology

## 2019-03-11 ENCOUNTER — Ambulatory Visit
Admission: RE | Admit: 2019-03-11 | Discharge: 2019-03-11 | Disposition: A | Discharge status: Home / Self Care | Payer: BC Managed Care – PPO | Source: Ambulatory Visit | Attending: Radiation Oncology | Admitting: Radiation Oncology

## 2019-03-11 ENCOUNTER — Other Ambulatory Visit: Payer: Self-pay

## 2019-03-11 DIAGNOSIS — Z510 Encounter for antineoplastic radiation therapy: Secondary | ICD-10-CM | POA: Insufficient documentation

## 2019-03-11 DIAGNOSIS — C61 Malignant neoplasm of prostate: Secondary | ICD-10-CM | POA: Diagnosis not present

## 2019-03-13 NOTE — Progress Notes (Signed)
  Radiation Oncology         [phone] ________________________________  Name: Keith Stone MRN: [id number]  Date: 03/11/2019  DOB: May 11, 1961  SIMULATION AND TREATMENT PLANNING NOTE    ICD-10-CM   1. Prostate cancer [facility])  C61     DIAGNOSIS:  58 y.o. gentleman with biochemical recurrence of prostate cancer s/p RALP 01/2014 for Stage pT3a, pN0 Gleason 3+4 adenocarcinoma of the prostate with PSA of 0.091  NARRATIVE:  The patient was brought to the [place].  Identity was confirmed.  All relevant records and images related to the planned course of therapy were reviewed.  The patient freely provided informed written consent to proceed with treatment after reviewing the details related to the planned course of therapy. The consent form was witnessed and verified by the simulation staff.  Then, the patient was set-up in a stable reproducible supine position for radiation therapy.  A vacuum lock pillow device was custom fabricated to position his legs in a reproducible immobilized position.  Then, I performed a urethrogram under sterile conditions to identify the prostatic apex.  CT images were obtained.  Surface markings were placed.  The CT images were loaded into the planning software.  Then the prostate target and avoidance structures including the rectum, bladder, bowel and hips were contoured.  Treatment planning then occurred.  The radiation prescription was entered and confirmed.  A total of one complex treatment devices was fabricated. I have requested : Intensity Modulated Radiotherapy (IMRT) is medically necessary for this case for the following reason:  Rectal sparing.Marland Kitchen  PLAN:  The patient will receive 68.4 Gy in 38 fractions.  ________________________________  Sheral Apley Tammi Klippel, M.D.

## 2019-03-15 NOTE — Progress Notes (Signed)
Introduced myself to patient as the prostate nurse navigator and discussed my role. He had prostatectomy in 2015 and now has rising PSA. He does not have a family history of prostate cancer. I gave him my business card and asked him to contact me with questions or concerns. He will begin radiation 9/29.

## 2019-03-18 DIAGNOSIS — C61 Malignant neoplasm of prostate: Secondary | ICD-10-CM | POA: Diagnosis not present

## 2019-03-18 DIAGNOSIS — Z510 Encounter for antineoplastic radiation therapy: Secondary | ICD-10-CM | POA: Diagnosis not present

## 2019-03-22 ENCOUNTER — Ambulatory Visit
Admission: RE | Admit: 2019-03-22 | Discharge: 2019-03-22 | Disposition: A | Discharge status: Home / Self Care | Payer: BC Managed Care – PPO | Source: Ambulatory Visit | Attending: Radiation Oncology | Admitting: Radiation Oncology

## 2019-03-22 DIAGNOSIS — Z510 Encounter for antineoplastic radiation therapy: Secondary | ICD-10-CM | POA: Diagnosis not present

## 2019-03-22 DIAGNOSIS — C61 Malignant neoplasm of prostate: Secondary | ICD-10-CM | POA: Diagnosis not present

## 2019-03-23 ENCOUNTER — Ambulatory Visit
Admission: RE | Admit: 2019-03-23 | Discharge: 2019-03-23 | Disposition: A | Discharge status: Home / Self Care | Payer: BC Managed Care – PPO | Source: Ambulatory Visit | Attending: Radiation Oncology | Admitting: Radiation Oncology

## 2019-03-23 DIAGNOSIS — Z510 Encounter for antineoplastic radiation therapy: Secondary | ICD-10-CM | POA: Diagnosis not present

## 2019-03-23 DIAGNOSIS — C61 Malignant neoplasm of prostate: Secondary | ICD-10-CM | POA: Diagnosis not present

## 2019-03-24 ENCOUNTER — Ambulatory Visit
Admission: RE | Admit: 2019-03-24 | Discharge: 2019-03-24 | Disposition: A | Discharge status: Home / Self Care | Payer: BC Managed Care – PPO | Source: Ambulatory Visit | Attending: Radiation Oncology | Admitting: Radiation Oncology

## 2019-03-24 ENCOUNTER — Other Ambulatory Visit: Payer: Self-pay

## 2019-03-24 DIAGNOSIS — C61 Malignant neoplasm of prostate: Secondary | ICD-10-CM | POA: Diagnosis not present

## 2019-03-24 DIAGNOSIS — Z510 Encounter for antineoplastic radiation therapy: Secondary | ICD-10-CM | POA: Diagnosis not present

## 2019-03-25 ENCOUNTER — Other Ambulatory Visit: Payer: Self-pay

## 2019-03-25 ENCOUNTER — Ambulatory Visit
Admission: RE | Admit: 2019-03-25 | Discharge: 2019-03-25 | Disposition: A | Discharge status: Home / Self Care | Payer: BC Managed Care – PPO | Source: Ambulatory Visit | Attending: Radiation Oncology | Admitting: Radiation Oncology

## 2019-03-25 DIAGNOSIS — Z510 Encounter for antineoplastic radiation therapy: Secondary | ICD-10-CM | POA: Diagnosis not present

## 2019-03-25 DIAGNOSIS — C61 Malignant neoplasm of prostate: Secondary | ICD-10-CM | POA: Diagnosis not present

## 2019-03-28 ENCOUNTER — Other Ambulatory Visit: Payer: Self-pay

## 2019-03-28 ENCOUNTER — Ambulatory Visit
Admission: RE | Admit: 2019-03-28 | Discharge: 2019-03-28 | Disposition: A | Discharge status: Home / Self Care | Payer: BC Managed Care – PPO | Source: Ambulatory Visit | Attending: Radiation Oncology | Admitting: Radiation Oncology

## 2019-03-28 DIAGNOSIS — C61 Malignant neoplasm of prostate: Secondary | ICD-10-CM | POA: Diagnosis not present

## 2019-03-28 DIAGNOSIS — Z510 Encounter for antineoplastic radiation therapy: Secondary | ICD-10-CM | POA: Diagnosis not present

## 2019-03-29 ENCOUNTER — Other Ambulatory Visit: Payer: Self-pay

## 2019-03-29 ENCOUNTER — Ambulatory Visit
Admission: RE | Admit: 2019-03-29 | Discharge: 2019-03-29 | Disposition: A | Discharge status: Home / Self Care | Payer: BC Managed Care – PPO | Source: Ambulatory Visit | Attending: Radiation Oncology | Admitting: Radiation Oncology

## 2019-03-29 DIAGNOSIS — Z510 Encounter for antineoplastic radiation therapy: Secondary | ICD-10-CM | POA: Diagnosis not present

## 2019-03-29 DIAGNOSIS — C61 Malignant neoplasm of prostate: Secondary | ICD-10-CM | POA: Diagnosis not present

## 2019-03-30 ENCOUNTER — Ambulatory Visit
Admission: RE | Admit: 2019-03-30 | Discharge: 2019-03-30 | Disposition: A | Discharge status: Home / Self Care | Payer: BC Managed Care – PPO | Source: Ambulatory Visit | Attending: Radiation Oncology | Admitting: Radiation Oncology

## 2019-03-30 ENCOUNTER — Other Ambulatory Visit: Payer: Self-pay

## 2019-03-30 DIAGNOSIS — Z510 Encounter for antineoplastic radiation therapy: Secondary | ICD-10-CM | POA: Diagnosis not present

## 2019-03-30 DIAGNOSIS — C61 Malignant neoplasm of prostate: Secondary | ICD-10-CM | POA: Diagnosis not present

## 2019-03-31 ENCOUNTER — Ambulatory Visit
Admission: RE | Admit: 2019-03-31 | Discharge: 2019-03-31 | Disposition: A | Discharge status: Home / Self Care | Payer: BC Managed Care – PPO | Source: Ambulatory Visit | Attending: Radiation Oncology | Admitting: Radiation Oncology

## 2019-03-31 DIAGNOSIS — Z510 Encounter for antineoplastic radiation therapy: Secondary | ICD-10-CM | POA: Diagnosis not present

## 2019-03-31 DIAGNOSIS — C61 Malignant neoplasm of prostate: Secondary | ICD-10-CM | POA: Diagnosis not present

## 2019-04-01 ENCOUNTER — Other Ambulatory Visit: Payer: Self-pay

## 2019-04-01 ENCOUNTER — Ambulatory Visit
Admission: RE | Admit: 2019-04-01 | Discharge: 2019-04-01 | Disposition: A | Discharge status: Home / Self Care | Payer: BC Managed Care – PPO | Source: Ambulatory Visit | Attending: Radiation Oncology | Admitting: Radiation Oncology

## 2019-04-01 DIAGNOSIS — Z510 Encounter for antineoplastic radiation therapy: Secondary | ICD-10-CM | POA: Diagnosis not present

## 2019-04-01 DIAGNOSIS — C61 Malignant neoplasm of prostate: Secondary | ICD-10-CM | POA: Diagnosis not present

## 2019-04-04 ENCOUNTER — Other Ambulatory Visit: Payer: Self-pay

## 2019-04-04 ENCOUNTER — Ambulatory Visit
Admission: RE | Admit: 2019-04-04 | Discharge: 2019-04-04 | Disposition: A | Discharge status: Home / Self Care | Payer: BC Managed Care – PPO | Source: Ambulatory Visit | Attending: Radiation Oncology | Admitting: Radiation Oncology

## 2019-04-04 DIAGNOSIS — Z510 Encounter for antineoplastic radiation therapy: Secondary | ICD-10-CM | POA: Diagnosis not present

## 2019-04-04 DIAGNOSIS — C61 Malignant neoplasm of prostate: Secondary | ICD-10-CM | POA: Diagnosis not present

## 2019-04-05 ENCOUNTER — Ambulatory Visit
Admission: RE | Admit: 2019-04-05 | Discharge: 2019-04-05 | Disposition: A | Discharge status: Home / Self Care | Payer: BC Managed Care – PPO | Source: Ambulatory Visit | Attending: Radiation Oncology | Admitting: Radiation Oncology

## 2019-04-05 ENCOUNTER — Other Ambulatory Visit: Payer: Self-pay

## 2019-04-05 DIAGNOSIS — Z510 Encounter for antineoplastic radiation therapy: Secondary | ICD-10-CM | POA: Diagnosis not present

## 2019-04-05 DIAGNOSIS — C61 Malignant neoplasm of prostate: Secondary | ICD-10-CM | POA: Diagnosis not present

## 2019-04-06 ENCOUNTER — Ambulatory Visit
Admission: RE | Admit: 2019-04-06 | Discharge: 2019-04-06 | Disposition: A | Discharge status: Home / Self Care | Payer: BC Managed Care – PPO | Source: Ambulatory Visit | Attending: Radiation Oncology | Admitting: Radiation Oncology

## 2019-04-06 DIAGNOSIS — Z510 Encounter for antineoplastic radiation therapy: Secondary | ICD-10-CM | POA: Diagnosis not present

## 2019-04-06 DIAGNOSIS — C61 Malignant neoplasm of prostate: Secondary | ICD-10-CM | POA: Diagnosis not present

## 2019-04-07 ENCOUNTER — Ambulatory Visit
Admission: RE | Admit: 2019-04-07 | Discharge: 2019-04-07 | Disposition: A | Discharge status: Home / Self Care | Payer: BC Managed Care – PPO | Source: Ambulatory Visit | Attending: Radiation Oncology | Admitting: Radiation Oncology

## 2019-04-07 ENCOUNTER — Other Ambulatory Visit: Payer: Self-pay

## 2019-04-07 DIAGNOSIS — C61 Malignant neoplasm of prostate: Secondary | ICD-10-CM | POA: Diagnosis not present

## 2019-04-07 DIAGNOSIS — Z510 Encounter for antineoplastic radiation therapy: Secondary | ICD-10-CM | POA: Diagnosis not present

## 2019-04-08 ENCOUNTER — Other Ambulatory Visit: Payer: Self-pay

## 2019-04-08 ENCOUNTER — Ambulatory Visit
Admission: RE | Admit: 2019-04-08 | Discharge: 2019-04-08 | Disposition: A | Discharge status: Home / Self Care | Payer: BC Managed Care – PPO | Source: Ambulatory Visit | Attending: Radiation Oncology | Admitting: Radiation Oncology

## 2019-04-08 DIAGNOSIS — Z510 Encounter for antineoplastic radiation therapy: Secondary | ICD-10-CM | POA: Diagnosis not present

## 2019-04-08 DIAGNOSIS — C61 Malignant neoplasm of prostate: Secondary | ICD-10-CM | POA: Diagnosis not present

## 2019-04-11 ENCOUNTER — Other Ambulatory Visit: Payer: Self-pay

## 2019-04-11 ENCOUNTER — Ambulatory Visit
Admission: RE | Admit: 2019-04-11 | Discharge: 2019-04-11 | Disposition: A | Discharge status: Home / Self Care | Payer: BC Managed Care – PPO | Source: Ambulatory Visit | Attending: Radiation Oncology | Admitting: Radiation Oncology

## 2019-04-11 DIAGNOSIS — Z510 Encounter for antineoplastic radiation therapy: Secondary | ICD-10-CM | POA: Diagnosis not present

## 2019-04-11 DIAGNOSIS — C61 Malignant neoplasm of prostate: Secondary | ICD-10-CM | POA: Diagnosis not present

## 2019-04-12 ENCOUNTER — Ambulatory Visit
Admission: RE | Admit: 2019-04-12 | Discharge: 2019-04-12 | Disposition: A | Discharge status: Home / Self Care | Payer: BC Managed Care – PPO | Source: Ambulatory Visit | Attending: Radiation Oncology | Admitting: Radiation Oncology

## 2019-04-12 DIAGNOSIS — G4733 Obstructive sleep apnea (adult) (pediatric): Secondary | ICD-10-CM | POA: Diagnosis not present

## 2019-04-12 DIAGNOSIS — I10 Essential (primary) hypertension: Secondary | ICD-10-CM | POA: Diagnosis not present

## 2019-04-12 DIAGNOSIS — Z510 Encounter for antineoplastic radiation therapy: Secondary | ICD-10-CM | POA: Diagnosis not present

## 2019-04-12 DIAGNOSIS — C61 Malignant neoplasm of prostate: Secondary | ICD-10-CM | POA: Diagnosis not present

## 2019-04-12 DIAGNOSIS — K219 Gastro-esophageal reflux disease without esophagitis: Secondary | ICD-10-CM | POA: Diagnosis not present

## 2019-04-13 ENCOUNTER — Ambulatory Visit
Admission: RE | Admit: 2019-04-13 | Discharge: 2019-04-13 | Disposition: A | Discharge status: Home / Self Care | Payer: BC Managed Care – PPO | Source: Ambulatory Visit | Attending: Radiation Oncology | Admitting: Radiation Oncology

## 2019-04-13 ENCOUNTER — Other Ambulatory Visit: Payer: Self-pay

## 2019-04-13 DIAGNOSIS — C61 Malignant neoplasm of prostate: Secondary | ICD-10-CM | POA: Diagnosis not present

## 2019-04-13 DIAGNOSIS — Z510 Encounter for antineoplastic radiation therapy: Secondary | ICD-10-CM | POA: Diagnosis not present

## 2019-04-14 ENCOUNTER — Other Ambulatory Visit: Payer: Self-pay

## 2019-04-14 ENCOUNTER — Ambulatory Visit
Admission: RE | Admit: 2019-04-14 | Discharge: 2019-04-14 | Disposition: A | Discharge status: Home / Self Care | Payer: BC Managed Care – PPO | Source: Ambulatory Visit | Attending: Radiation Oncology | Admitting: Radiation Oncology

## 2019-04-14 DIAGNOSIS — C61 Malignant neoplasm of prostate: Secondary | ICD-10-CM | POA: Diagnosis not present

## 2019-04-14 DIAGNOSIS — Z510 Encounter for antineoplastic radiation therapy: Secondary | ICD-10-CM | POA: Diagnosis not present

## 2019-04-15 ENCOUNTER — Other Ambulatory Visit: Payer: Self-pay

## 2019-04-15 ENCOUNTER — Ambulatory Visit
Admission: RE | Admit: 2019-04-15 | Discharge: 2019-04-15 | Disposition: A | Discharge status: Home / Self Care | Payer: BC Managed Care – PPO | Source: Ambulatory Visit | Attending: Radiation Oncology | Admitting: Radiation Oncology

## 2019-04-15 DIAGNOSIS — Z510 Encounter for antineoplastic radiation therapy: Secondary | ICD-10-CM | POA: Diagnosis not present

## 2019-04-15 DIAGNOSIS — C61 Malignant neoplasm of prostate: Secondary | ICD-10-CM | POA: Diagnosis not present

## 2019-04-18 ENCOUNTER — Other Ambulatory Visit: Payer: Self-pay

## 2019-04-18 ENCOUNTER — Ambulatory Visit
Admission: RE | Admit: 2019-04-18 | Discharge: 2019-04-18 | Disposition: A | Discharge status: Home / Self Care | Payer: BC Managed Care – PPO | Source: Ambulatory Visit | Attending: Radiation Oncology | Admitting: Radiation Oncology

## 2019-04-18 DIAGNOSIS — C61 Malignant neoplasm of prostate: Secondary | ICD-10-CM | POA: Diagnosis not present

## 2019-04-18 DIAGNOSIS — Z510 Encounter for antineoplastic radiation therapy: Secondary | ICD-10-CM | POA: Diagnosis not present

## 2019-04-19 ENCOUNTER — Ambulatory Visit
Admission: RE | Admit: 2019-04-19 | Discharge: 2019-04-19 | Disposition: A | Discharge status: Home / Self Care | Payer: BC Managed Care – PPO | Source: Ambulatory Visit | Attending: Radiation Oncology | Admitting: Radiation Oncology

## 2019-04-19 ENCOUNTER — Other Ambulatory Visit: Payer: Self-pay

## 2019-04-19 DIAGNOSIS — Z510 Encounter for antineoplastic radiation therapy: Secondary | ICD-10-CM | POA: Diagnosis not present

## 2019-04-19 DIAGNOSIS — C61 Malignant neoplasm of prostate: Secondary | ICD-10-CM | POA: Diagnosis not present

## 2019-04-20 ENCOUNTER — Ambulatory Visit
Admission: RE | Admit: 2019-04-20 | Discharge: 2019-04-20 | Disposition: A | Discharge status: Home / Self Care | Payer: BC Managed Care – PPO | Source: Ambulatory Visit | Attending: Radiation Oncology | Admitting: Radiation Oncology

## 2019-04-20 DIAGNOSIS — C61 Malignant neoplasm of prostate: Secondary | ICD-10-CM | POA: Diagnosis not present

## 2019-04-20 DIAGNOSIS — Z510 Encounter for antineoplastic radiation therapy: Secondary | ICD-10-CM | POA: Diagnosis not present

## 2019-04-21 ENCOUNTER — Other Ambulatory Visit: Payer: Self-pay

## 2019-04-21 ENCOUNTER — Ambulatory Visit
Admission: RE | Admit: 2019-04-21 | Discharge: 2019-04-21 | Disposition: A | Discharge status: Home / Self Care | Payer: BC Managed Care – PPO | Source: Ambulatory Visit | Attending: Radiation Oncology | Admitting: Radiation Oncology

## 2019-04-21 DIAGNOSIS — Z510 Encounter for antineoplastic radiation therapy: Secondary | ICD-10-CM | POA: Diagnosis not present

## 2019-04-21 DIAGNOSIS — C61 Malignant neoplasm of prostate: Secondary | ICD-10-CM | POA: Diagnosis not present

## 2019-04-22 ENCOUNTER — Ambulatory Visit
Admission: RE | Admit: 2019-04-22 | Discharge: 2019-04-22 | Disposition: A | Discharge status: Home / Self Care | Payer: BC Managed Care – PPO | Source: Ambulatory Visit | Attending: Radiation Oncology | Admitting: Radiation Oncology

## 2019-04-22 ENCOUNTER — Other Ambulatory Visit: Payer: Self-pay

## 2019-04-22 DIAGNOSIS — Z510 Encounter for antineoplastic radiation therapy: Secondary | ICD-10-CM | POA: Diagnosis not present

## 2019-04-22 DIAGNOSIS — C61 Malignant neoplasm of prostate: Secondary | ICD-10-CM | POA: Diagnosis not present

## 2019-04-25 ENCOUNTER — Ambulatory Visit
Admission: RE | Admit: 2019-04-25 | Discharge: 2019-04-25 | Disposition: A | Discharge status: Home / Self Care | Payer: BC Managed Care – PPO | Source: Ambulatory Visit | Attending: Radiation Oncology | Admitting: Radiation Oncology

## 2019-04-25 DIAGNOSIS — Z510 Encounter for antineoplastic radiation therapy: Secondary | ICD-10-CM | POA: Insufficient documentation

## 2019-04-25 DIAGNOSIS — C61 Malignant neoplasm of prostate: Secondary | ICD-10-CM | POA: Diagnosis not present

## 2019-04-26 ENCOUNTER — Other Ambulatory Visit: Payer: Self-pay

## 2019-04-26 ENCOUNTER — Ambulatory Visit
Admission: RE | Admit: 2019-04-26 | Discharge: 2019-04-26 | Disposition: A | Discharge status: Home / Self Care | Payer: BC Managed Care – PPO | Source: Ambulatory Visit | Attending: Radiation Oncology | Admitting: Radiation Oncology

## 2019-04-26 DIAGNOSIS — C61 Malignant neoplasm of prostate: Secondary | ICD-10-CM | POA: Diagnosis not present

## 2019-04-26 DIAGNOSIS — Z510 Encounter for antineoplastic radiation therapy: Secondary | ICD-10-CM | POA: Diagnosis not present

## 2019-04-27 ENCOUNTER — Ambulatory Visit
Admission: RE | Admit: 2019-04-27 | Discharge: 2019-04-27 | Disposition: A | Discharge status: Home / Self Care | Payer: BC Managed Care – PPO | Source: Ambulatory Visit | Attending: Radiation Oncology | Admitting: Radiation Oncology

## 2019-04-27 DIAGNOSIS — Z510 Encounter for antineoplastic radiation therapy: Secondary | ICD-10-CM | POA: Diagnosis not present

## 2019-04-27 DIAGNOSIS — C61 Malignant neoplasm of prostate: Secondary | ICD-10-CM | POA: Diagnosis not present

## 2019-04-28 ENCOUNTER — Ambulatory Visit
Admission: RE | Admit: 2019-04-28 | Discharge: 2019-04-28 | Disposition: A | Discharge status: Home / Self Care | Payer: BC Managed Care – PPO | Source: Ambulatory Visit | Attending: Radiation Oncology | Admitting: Radiation Oncology

## 2019-04-28 ENCOUNTER — Other Ambulatory Visit: Payer: Self-pay

## 2019-04-28 DIAGNOSIS — Z510 Encounter for antineoplastic radiation therapy: Secondary | ICD-10-CM | POA: Diagnosis not present

## 2019-04-28 DIAGNOSIS — C61 Malignant neoplasm of prostate: Secondary | ICD-10-CM | POA: Diagnosis not present

## 2019-04-29 ENCOUNTER — Other Ambulatory Visit: Payer: Self-pay

## 2019-04-29 ENCOUNTER — Ambulatory Visit
Admission: RE | Admit: 2019-04-29 | Discharge: 2019-04-29 | Disposition: A | Discharge status: Home / Self Care | Payer: BC Managed Care – PPO | Source: Ambulatory Visit | Attending: Radiation Oncology | Admitting: Radiation Oncology

## 2019-04-29 DIAGNOSIS — C61 Malignant neoplasm of prostate: Secondary | ICD-10-CM | POA: Diagnosis not present

## 2019-04-29 DIAGNOSIS — Z510 Encounter for antineoplastic radiation therapy: Secondary | ICD-10-CM | POA: Diagnosis not present

## 2019-05-02 ENCOUNTER — Ambulatory Visit
Admission: RE | Admit: 2019-05-02 | Discharge: 2019-05-02 | Disposition: A | Discharge status: Home / Self Care | Payer: BC Managed Care – PPO | Source: Ambulatory Visit | Attending: Radiation Oncology | Admitting: Radiation Oncology

## 2019-05-02 ENCOUNTER — Other Ambulatory Visit: Payer: Self-pay

## 2019-05-02 DIAGNOSIS — Z510 Encounter for antineoplastic radiation therapy: Secondary | ICD-10-CM | POA: Diagnosis not present

## 2019-05-02 DIAGNOSIS — C61 Malignant neoplasm of prostate: Secondary | ICD-10-CM | POA: Diagnosis not present

## 2019-05-03 ENCOUNTER — Other Ambulatory Visit: Payer: Self-pay

## 2019-05-03 ENCOUNTER — Ambulatory Visit
Admission: RE | Admit: 2019-05-03 | Discharge: 2019-05-03 | Disposition: A | Discharge status: Home / Self Care | Payer: BC Managed Care – PPO | Source: Ambulatory Visit | Attending: Radiation Oncology | Admitting: Radiation Oncology

## 2019-05-03 DIAGNOSIS — C61 Malignant neoplasm of prostate: Secondary | ICD-10-CM | POA: Diagnosis not present

## 2019-05-03 DIAGNOSIS — Z510 Encounter for antineoplastic radiation therapy: Secondary | ICD-10-CM | POA: Diagnosis not present

## 2019-05-04 ENCOUNTER — Other Ambulatory Visit: Payer: Self-pay

## 2019-05-04 ENCOUNTER — Ambulatory Visit
Admission: RE | Admit: 2019-05-04 | Discharge: 2019-05-04 | Disposition: A | Discharge status: Home / Self Care | Payer: BC Managed Care – PPO | Source: Ambulatory Visit | Attending: Radiation Oncology | Admitting: Radiation Oncology

## 2019-05-04 DIAGNOSIS — C61 Malignant neoplasm of prostate: Secondary | ICD-10-CM | POA: Diagnosis not present

## 2019-05-04 DIAGNOSIS — Z510 Encounter for antineoplastic radiation therapy: Secondary | ICD-10-CM | POA: Diagnosis not present

## 2019-05-05 ENCOUNTER — Other Ambulatory Visit: Payer: Self-pay

## 2019-05-05 ENCOUNTER — Ambulatory Visit
Admission: RE | Admit: 2019-05-05 | Discharge: 2019-05-05 | Disposition: A | Discharge status: Home / Self Care | Payer: BC Managed Care – PPO | Source: Ambulatory Visit | Attending: Radiation Oncology | Admitting: Radiation Oncology

## 2019-05-05 DIAGNOSIS — Z510 Encounter for antineoplastic radiation therapy: Secondary | ICD-10-CM | POA: Diagnosis not present

## 2019-05-05 DIAGNOSIS — C61 Malignant neoplasm of prostate: Secondary | ICD-10-CM | POA: Diagnosis not present

## 2019-05-06 ENCOUNTER — Other Ambulatory Visit: Payer: Self-pay

## 2019-05-06 ENCOUNTER — Ambulatory Visit
Admission: RE | Admit: 2019-05-06 | Discharge: 2019-05-06 | Disposition: A | Discharge status: Home / Self Care | Payer: BC Managed Care – PPO | Source: Ambulatory Visit | Attending: Radiation Oncology | Admitting: Radiation Oncology

## 2019-05-06 DIAGNOSIS — Z510 Encounter for antineoplastic radiation therapy: Secondary | ICD-10-CM | POA: Diagnosis not present

## 2019-05-06 DIAGNOSIS — C61 Malignant neoplasm of prostate: Secondary | ICD-10-CM | POA: Diagnosis not present

## 2019-05-09 ENCOUNTER — Other Ambulatory Visit: Payer: Self-pay

## 2019-05-09 ENCOUNTER — Ambulatory Visit
Admission: RE | Admit: 2019-05-09 | Discharge: 2019-05-09 | Disposition: A | Discharge status: Home / Self Care | Payer: BC Managed Care – PPO | Source: Ambulatory Visit | Attending: Radiation Oncology | Admitting: Radiation Oncology

## 2019-05-09 DIAGNOSIS — C61 Malignant neoplasm of prostate: Secondary | ICD-10-CM | POA: Diagnosis not present

## 2019-05-09 DIAGNOSIS — Z510 Encounter for antineoplastic radiation therapy: Secondary | ICD-10-CM | POA: Diagnosis not present

## 2019-05-10 ENCOUNTER — Other Ambulatory Visit: Payer: Self-pay

## 2019-05-10 ENCOUNTER — Ambulatory Visit
Admission: RE | Admit: 2019-05-10 | Discharge: 2019-05-10 | Disposition: A | Discharge status: Home / Self Care | Payer: BC Managed Care – PPO | Source: Ambulatory Visit | Attending: Radiation Oncology | Admitting: Radiation Oncology

## 2019-05-10 DIAGNOSIS — C61 Malignant neoplasm of prostate: Secondary | ICD-10-CM | POA: Diagnosis not present

## 2019-05-10 DIAGNOSIS — Z510 Encounter for antineoplastic radiation therapy: Secondary | ICD-10-CM | POA: Diagnosis not present

## 2019-05-11 ENCOUNTER — Other Ambulatory Visit: Payer: Self-pay

## 2019-05-11 ENCOUNTER — Ambulatory Visit
Admission: RE | Admit: 2019-05-11 | Discharge: 2019-05-11 | Disposition: A | Discharge status: Home / Self Care | Payer: BC Managed Care – PPO | Source: Ambulatory Visit | Attending: Radiation Oncology | Admitting: Radiation Oncology

## 2019-05-11 DIAGNOSIS — C61 Malignant neoplasm of prostate: Secondary | ICD-10-CM | POA: Diagnosis not present

## 2019-05-11 DIAGNOSIS — Z510 Encounter for antineoplastic radiation therapy: Secondary | ICD-10-CM | POA: Diagnosis not present

## 2019-05-12 ENCOUNTER — Other Ambulatory Visit: Payer: Self-pay

## 2019-05-12 ENCOUNTER — Encounter: Payer: Self-pay | Admitting: Radiation Oncology

## 2019-05-12 ENCOUNTER — Ambulatory Visit
Admission: RE | Admit: 2019-05-12 | Discharge: 2019-05-12 | Disposition: A | Discharge status: Home / Self Care | Payer: BC Managed Care – PPO | Source: Ambulatory Visit | Attending: Radiation Oncology | Admitting: Radiation Oncology

## 2019-05-12 DIAGNOSIS — Z510 Encounter for antineoplastic radiation therapy: Secondary | ICD-10-CM | POA: Diagnosis not present

## 2019-05-12 DIAGNOSIS — C61 Malignant neoplasm of prostate: Secondary | ICD-10-CM | POA: Diagnosis not present

## 2019-05-29 DIAGNOSIS — Z20828 Contact with and (suspected) exposure to other viral communicable diseases: Secondary | ICD-10-CM | POA: Diagnosis not present

## 2019-06-02 DIAGNOSIS — Z20818 Contact with and (suspected) exposure to other bacterial communicable diseases: Secondary | ICD-10-CM | POA: Diagnosis not present

## 2019-06-02 DIAGNOSIS — G8929 Other chronic pain: Secondary | ICD-10-CM | POA: Diagnosis not present

## 2019-06-02 DIAGNOSIS — J069 Acute upper respiratory infection, unspecified: Secondary | ICD-10-CM | POA: Diagnosis not present

## 2019-06-02 DIAGNOSIS — R05 Cough: Secondary | ICD-10-CM | POA: Diagnosis not present

## 2019-06-10 NOTE — Progress Notes (Signed)
  Radiation Oncology         [phone]) [phone] ________________________________  Name: Keith Stone MRN: [id number]  Date: 05/12/2019  DOB: [birth year]  End of Treatment Note  Diagnosis:   58 y.o. gentleman with biochemical recurrence of prostate cancer s/p RALP 01/2014 for Stage pT3a, pN0 Gleason 3+4 adenocarcinoma of the prostate     Indication for treatment:  Curative, Salvage Prostatic Fossa Radiotherapy       Radiation treatment dates:   03/22/2019 - 05/12/2019  Site/dose:   The prostatic fossa was treated to 68.4 Gy in 38 fractions of 1.8 Gy  Beams/energy:   The prostatic fossa was treated using VMAT intensity modulated radiotherapy delivering 6 megavolt photons. Image guidance was performed with CB-CT studies prior to each fraction. He was immobilized with a body fix lower extremity mold.  Narrative: The patient tolerated radiation treatment relatively well. He reported nocturia x3-4, dysuria managed with AZO, urinary urgency, post-void dribble, and moderate to severe fatigue. He denied hematuria, difficulty emptying his bladder, diarrhea, and pain throughout treatment.    Plan: The patient has completed radiation treatment. He will return to radiation oncology clinic for routine followup in one month. I advised him to call or return sooner if he has any questions or concerns related to his recovery or treatment. ________________________________  Sheral Apley. Tammi Klippel, M.D.  This document serves as a record of services personally performed by Tyler Pita, MD. It was created on his behalf by Wilburn Mylar, a trained medical scribe. The creation of this record is based on the scribe's personal observations and the provider's statements to them. This document has been checked and approved by the attending provider.

## 2019-06-22 ENCOUNTER — Ambulatory Visit
Admission: RE | Admit: 2019-06-22 | Discharge: 2019-06-22 | Disposition: A | Discharge status: Home / Self Care | Payer: BC Managed Care – PPO | Source: Ambulatory Visit | Attending: Urology | Admitting: Urology

## 2019-06-22 ENCOUNTER — Encounter: Payer: Self-pay | Admitting: Urology

## 2019-06-22 ENCOUNTER — Other Ambulatory Visit: Payer: Self-pay

## 2019-06-22 DIAGNOSIS — C61 Malignant neoplasm of prostate: Secondary | ICD-10-CM

## 2019-06-22 NOTE — Progress Notes (Signed)
Radiation Oncology         [phone]) [phone] ________________________________  Name: AULDEN MOREN MRN: [id number]  Date: 06/22/2019  DOB: [birth year]  Post Treatment Note  CC: Prince Solian, MD  Prince Solian, MD  Diagnosis:   58 y.o. gentleman with biochemical recurrenceof prostate cancer with rising, detectable PSA of 0.091s/p RALP 01/2014 forStagepT3a, pN0Gleason 3+4adenocarcinoma of the prostate.     Interval Since Last Radiation:  6 weeks  03/22/2019 - 05/12/2019:  The prostatic fossa was treated to 68.4 Gy in 38 fractions of 1.8 Gy  Narrative: I spoke with the patient to conduct his routine scheduled 1 month follow up visit via telephone to spare the patient unnecessary potential exposure in the healthcare setting during the current COVID-19 pandemic.  The patient was notified in advance and gave permission to proceed with this visit format. He tolerated radiation treatment relatively well. He reported nocturia x3-4, dysuria managed with AZO, urinary urgency, post-void dribble, and moderate to severe fatigue. He denied hematuria, difficulty emptying his bladder, diarrhea, and pain throughout treatment.                                On review of systems, the patient states that his LUTS are gradually improving.  He continues to use the AZO periodically but does not need this regularly.  His energy level has returned back to normal and he is able to work full-time without issue.  He specifically denies gross hematuria, straining to void, excessive daytime frequency, incomplete bladder emptying or incontinence.  He reports a moderate, steady stream with voiding and feels that he empties his bladder well.  He denies suprapubic discomfort, abdominal pain, nausea, vomiting, diarrhea or constipation.  He reports a healthy appetite and is maintaining his weight.  Overall, he is quite pleased with his progress to date.  ALLERGIES:  has No Known Allergies.  Meds: Current Outpatient  Medications  Medication Sig Dispense Refill  . carvedilol (COREG) 12.5 MG tablet Take 12.5 mg by mouth 2 (two) times daily with a meal.    . felodipine (PLENDIL) 10 MG 24 hr tablet Take 10 mg by mouth daily.    Marland Kitchen HYDROcodone-acetaminophen (NORCO/VICODIN) 5-325 MG per tablet Take 1-2 tablets by mouth every 6 (six) hours as needed for moderate pain. 30 tablet 0  . losartan-hydrochlorothiazide (HYZAAR) 100-25 MG per tablet Take 0.5 tablets by mouth 2 (two) times daily.     Marland Kitchen zolpidem (AMBIEN) 10 MG tablet Take 10 mg by mouth at bedtime as needed.     Current Facility-Administered Medications  Medication Dose Route Frequency Provider Last Rate Last Admin  . 0.9 %  sodium chloride infusion  500 mL Intravenous Once Ladene Artist, MD        Physical Findings:  vitals were not taken for this visit.   Karen Kays to assess due to telephone follow-up visit format.  Lab Findings: Lab Results  Component Value Date   WBC 6.1 02/16/2014   HGB 11.4 (L) 02/23/2014   HCT 33.9 (L) 02/23/2014   MCV 85.0 02/16/2014   PLT 238 02/16/2014     Radiographic Findings: No results found.  Impression/Plan: 1. 58 y.o. gentleman with biochemical recurrenceof prostate cancerrising, detectable PSA of 0.091s/p RALP 01/2014 forStagepT3a, pN0Gleason 3+4adenocarcinoma of the prostate.    He will continue to follow up with urology for ongoing PSA determinations and has an appointment scheduled with Dr. Louis Meckel in late Jan. 2021. He  understands what to expect with regards to PSA monitoring going forward. I will look forward to following his response to treatment via correspondence with urology, and would be happy to continue to participate in his care if clinically indicated. I talked to the patient about what to expect in the future, including his risk for erectile dysfunction and rectal bleeding. I encouraged him to call or return to the office if he has any questions regarding his previous radiation or possible  radiation side effects. He was comfortable with this plan and will follow up as needed.    Nicholos Johns, PA-C

## 2019-06-24 DIAGNOSIS — Z8616 Personal history of COVID-19: Secondary | ICD-10-CM

## 2019-06-24 HISTORY — DX: Personal history of COVID-19: Z86.16

## 2019-06-28 DIAGNOSIS — U071 COVID-19: Secondary | ICD-10-CM | POA: Diagnosis not present

## 2019-06-28 DIAGNOSIS — R062 Wheezing: Secondary | ICD-10-CM | POA: Diagnosis not present

## 2019-06-29 ENCOUNTER — Ambulatory Visit: Payer: Self-pay | Admitting: Urology

## 2019-08-02 DIAGNOSIS — Z8546 Personal history of malignant neoplasm of prostate: Secondary | ICD-10-CM | POA: Diagnosis not present

## 2019-08-09 DIAGNOSIS — N393 Stress incontinence (female) (male): Secondary | ICD-10-CM | POA: Diagnosis not present

## 2019-08-09 DIAGNOSIS — Z8546 Personal history of malignant neoplasm of prostate: Secondary | ICD-10-CM | POA: Diagnosis not present

## 2019-10-10 DIAGNOSIS — E291 Testicular hypofunction: Secondary | ICD-10-CM | POA: Diagnosis not present

## 2019-10-10 DIAGNOSIS — E7849 Other hyperlipidemia: Secondary | ICD-10-CM | POA: Diagnosis not present

## 2019-10-10 DIAGNOSIS — Z125 Encounter for screening for malignant neoplasm of prostate: Secondary | ICD-10-CM | POA: Diagnosis not present

## 2019-10-10 DIAGNOSIS — Z0000 Encounter for general adult medical examination without abnormal findings: Secondary | ICD-10-CM | POA: Diagnosis not present

## 2019-10-17 DIAGNOSIS — R82998 Other abnormal findings in urine: Secondary | ICD-10-CM | POA: Diagnosis not present

## 2019-10-17 DIAGNOSIS — K625 Hemorrhage of anus and rectum: Secondary | ICD-10-CM | POA: Diagnosis not present

## 2019-10-17 DIAGNOSIS — Z0000 Encounter for general adult medical examination without abnormal findings: Secondary | ICD-10-CM | POA: Diagnosis not present

## 2019-10-17 DIAGNOSIS — E7849 Other hyperlipidemia: Secondary | ICD-10-CM | POA: Diagnosis not present

## 2019-10-17 DIAGNOSIS — K429 Umbilical hernia without obstruction or gangrene: Secondary | ICD-10-CM | POA: Diagnosis not present

## 2019-10-17 DIAGNOSIS — I10 Essential (primary) hypertension: Secondary | ICD-10-CM | POA: Diagnosis not present

## 2019-10-18 DIAGNOSIS — Z1212 Encounter for screening for malignant neoplasm of rectum: Secondary | ICD-10-CM | POA: Diagnosis not present

## 2019-10-18 LAB — IFOBT (OCCULT BLOOD): IFOBT: NEGATIVE

## 2019-11-01 ENCOUNTER — Ambulatory Visit (INDEPENDENT_AMBULATORY_CARE_PROVIDER_SITE_OTHER): Payer: BC Managed Care – PPO | Admitting: Nurse Practitioner

## 2019-11-01 ENCOUNTER — Encounter: Payer: Self-pay | Admitting: Nurse Practitioner

## 2019-11-01 VITALS — BP 124/80 | HR 72 | Temp 98.2°F | Ht 73.0 in | Wt 246.8 lb

## 2019-11-01 DIAGNOSIS — K625 Hemorrhage of anus and rectum: Secondary | ICD-10-CM

## 2019-11-01 NOTE — Patient Instructions (Signed)
If you are age 59 or older, your body mass index should be between 23-30. Your Body mass index is 32.56 kg/m. If this is out of the aforementioned range listed, please consider follow up with your Primary Care Provider.  If you are age 54 or younger, your body mass index should be between 19-25. Your Body mass index is 32.56 kg/m. If this is out of the aformentioned range listed, please consider follow up with your Primary Care Provider.   Please call the office if you notice any episodes of rectal bleeding.  Follow up as needed.

## 2019-11-01 NOTE — Progress Notes (Signed)
IMPRESSION and PLAN:    59 year old male with PMH significant for adenomatous colon polyps, diverticulosis, prostate cancer status post radiation, obstructive sleep apnea, hypertension, asthma  # Painless rectal bleeding  --Occurred intermittently every 3 days one  month ago.  Bleeding was with bowel movements, no more than once a day . He does not feel like the amount of blood loss was significant --No bleeding in 1 month, bowel movements back to baseline.  --Doubt fissure in the absence of pain.  He could have developed an internal hemorrhoid.  Given that he has not had any bleeding in a month we will hold on off on evaluation/treatment for now.  He will call us if bleeding recurs  --No constipation/straining so we will hold off on starting a bowel regimen.   #Adenomatous colon polyps --Due for surveillance colonoscopy January 2022  HPI:    Primary GI:   Lucio Edward, MD   Chief complaint : Rectal bleeding last month  Dreshun is a 59 year old male for evaluation of rectal bleeding.  He had intermittent rectal bleeding over 3-day course about a month ago, no bleeding since.  Bleeding was not associated with any rectal or abdominal pain.  He denies constipation/straining.  Patient feels like the bleeding was minimal.  His bowel moods are back to normal.  He does note that since radiation for prostate cancer November of last year he does pass a small amount of mucus after bowel movements  Previous Endoscopic Evaluations: Colonoscopy 06/26/2017 --Several small adenomatous polyps removed --Diverticulosis  Review of systems:     No chest pain, no SOB, no fevers, no urinary sx   Past Medical History:  Diagnosis Date  . Adenomatous colon polyp 08/26/2011   HYPERPLASTIC POLYP (MULTIPLE FRAGMENTS AND TUBULAR ADENOMA (ONE FRAGMENT)  . Allergic rhinitis   . Allergy   . Asthma    as child, none now  . Complication of anesthesia 20 years ago   woke up with mouth oral  surgery  . DJD (degenerative joint disease)   . GERD (gastroesophageal reflux disease)    occ uses alka seltzer  . Hypertension   . Insomnia   . Obstructive sleep apnea    on CPAP setting of 8 uses once or twice a month  . Palpitations   . Prostate cancer ([place])   . Sleep apnea    no cpap     Patient's surgical history, family medical history, social history, medications and allergies were all reviewed in Epic   Creatinine clearance cannot be calculated (Patient's most recent lab result is older than the maximum 21 days allowed.)  Current Outpatient Medications  Medication Sig Dispense Refill  . carvedilol (COREG) 12.5 MG tablet Take 12.5 mg by mouth 2 (two) times daily with a meal.    . felodipine (PLENDIL) 10 MG 24 hr tablet Take 10 mg by mouth daily.    Marland Kitchen HYDROcodone-acetaminophen (NORCO/VICODIN) 5-325 MG per tablet Take 1-2 tablets by mouth every 6 (six) hours as needed for moderate pain. 30 tablet 0  . losartan-hydrochlorothiazide (HYZAAR) 100-25 MG per tablet Take 0.5 tablets by mouth 2 (two) times daily.     Marland Kitchen zolpidem (AMBIEN) 10 MG tablet Take 10 mg by mouth at bedtime as needed.     Current Facility-Administered Medications  Medication Dose Route Frequency Provider Last Rate Last Admin  . 0.9 %  sodium chloride infusion  500 mL Intravenous Once Ladene Artist, MD  Physical Exam:     BP 124/80   Pulse 72   Temp 98.2 F (36.8 C)   Ht 6\' 1"  (1.854 m)   Wt 246 lb 12.8 oz (111.9 kg)   BMI 32.56 kg/m   GENERAL:  Pleasant male in NAD PSYCH: : Cooperative, normal affect CARDIAC:  RRR,  PULM: Normal respiratory effort, lungs CTA bilaterally, no wheezing ABDOMEN:  Nondistended, soft, nontender. No obvious masses, no hepatomegaly,  normal bowel sounds SKIN:  turgor, no lesions seen Musculoskeletal:  Normal muscle tone, normal strength NEURO: Alert and oriented x 3, no focal neurologic deficits   Tye Savoy , NP 11/01/2019, 9:09 AM

## 2019-11-01 NOTE — Progress Notes (Signed)
Reviewed and agree with management plan.  Omaree Fuqua T. Namiyah Grantham, MD FACG [facility]  

## 2020-01-17 DIAGNOSIS — M5380 Other specified dorsopathies, site unspecified: Secondary | ICD-10-CM | POA: Diagnosis not present

## 2020-01-17 DIAGNOSIS — G8929 Other chronic pain: Secondary | ICD-10-CM | POA: Diagnosis not present

## 2020-03-12 ENCOUNTER — Other Ambulatory Visit: Payer: Self-pay

## 2020-03-12 ENCOUNTER — Emergency Department (HOSPITAL_COMMUNITY): Payer: BC Managed Care – PPO

## 2020-03-12 ENCOUNTER — Encounter (HOSPITAL_COMMUNITY): Payer: Self-pay

## 2020-03-12 ENCOUNTER — Emergency Department (HOSPITAL_COMMUNITY)
Admission: EM | Admit: 2020-03-12 | Discharge: 2020-03-12 | Disposition: A | Discharge status: Home / Self Care | Payer: BC Managed Care – PPO | Attending: Emergency Medicine | Admitting: Emergency Medicine

## 2020-03-12 DIAGNOSIS — J45909 Unspecified asthma, uncomplicated: Secondary | ICD-10-CM | POA: Diagnosis not present

## 2020-03-12 DIAGNOSIS — Z79899 Other long term (current) drug therapy: Secondary | ICD-10-CM | POA: Insufficient documentation

## 2020-03-12 DIAGNOSIS — R0602 Shortness of breath: Secondary | ICD-10-CM | POA: Diagnosis not present

## 2020-03-12 DIAGNOSIS — R0603 Acute respiratory distress: Secondary | ICD-10-CM | POA: Diagnosis not present

## 2020-03-12 DIAGNOSIS — R0689 Other abnormalities of breathing: Secondary | ICD-10-CM | POA: Diagnosis not present

## 2020-03-12 DIAGNOSIS — T782XXA Anaphylactic shock, unspecified, initial encounter: Secondary | ICD-10-CM | POA: Insufficient documentation

## 2020-03-12 DIAGNOSIS — R55 Syncope and collapse: Secondary | ICD-10-CM | POA: Diagnosis not present

## 2020-03-12 DIAGNOSIS — F17290 Nicotine dependence, other tobacco product, uncomplicated: Secondary | ICD-10-CM | POA: Insufficient documentation

## 2020-03-12 DIAGNOSIS — I10 Essential (primary) hypertension: Secondary | ICD-10-CM | POA: Diagnosis not present

## 2020-03-12 DIAGNOSIS — Z20822 Contact with and (suspected) exposure to covid-19: Secondary | ICD-10-CM | POA: Insufficient documentation

## 2020-03-12 DIAGNOSIS — T7809XA Anaphylactic reaction due to other food products, initial encounter: Secondary | ICD-10-CM | POA: Diagnosis not present

## 2020-03-12 DIAGNOSIS — R0902 Hypoxemia: Secondary | ICD-10-CM | POA: Diagnosis not present

## 2020-03-12 LAB — CBC WITH DIFFERENTIAL/PLATELET
Abs Immature Granulocytes: 0.07 10*3/uL (ref 0.00–0.07)
Basophils Absolute: 0 10*3/uL (ref 0.0–0.1)
Basophils Relative: 0 %
Eosinophils Absolute: 0.1 10*3/uL (ref 0.0–0.5)
Eosinophils Relative: 1 %
HCT: 49.6 % (ref 39.0–52.0)
Hemoglobin: 16.7 g/dL (ref 13.0–17.0)
Immature Granulocytes: 1 %
Lymphocytes Relative: 16 %
Lymphs Abs: 2.2 10*3/uL (ref 0.7–4.0)
MCH: 30.4 pg (ref 26.0–34.0)
MCHC: 33.7 g/dL (ref 30.0–36.0)
MCV: 90.3 fL (ref 80.0–100.0)
Monocytes Absolute: 0.5 10*3/uL (ref 0.1–1.0)
Monocytes Relative: 4 %
Neutro Abs: 10.9 10*3/uL — ABNORMAL HIGH (ref 1.7–7.7)
Neutrophils Relative %: 78 %
Platelets: 348 10*3/uL (ref 150–400)
RBC: 5.49 MIL/uL (ref 4.22–5.81)
RDW: 13.1 % (ref 11.5–15.5)
WBC: 13.9 10*3/uL — ABNORMAL HIGH (ref 4.0–10.5)
nRBC: 0 % (ref 0.0–0.2)

## 2020-03-12 LAB — I-STAT VENOUS BLOOD GAS, ED
Acid-Base Excess: 0 mmol/L (ref 0.0–2.0)
Bicarbonate: 26.2 mmol/L (ref 20.0–28.0)
Calcium, Ion: 1.13 mmol/L — ABNORMAL LOW (ref 1.15–1.40)
HCT: 49 % (ref 39.0–52.0)
Hemoglobin: 16.7 g/dL (ref 13.0–17.0)
O2 Saturation: 68 %
Potassium: 2.7 mmol/L — CL (ref 3.5–5.1)
Sodium: 143 mmol/L (ref 135–145)
TCO2: 28 mmol/L (ref 22–32)
pCO2, Ven: 46.2 mmHg (ref 44.0–60.0)
pH, Ven: 7.362 (ref 7.250–7.430)
pO2, Ven: 37 mmHg (ref 32.0–45.0)

## 2020-03-12 LAB — BASIC METABOLIC PANEL
Anion gap: 16 — ABNORMAL HIGH (ref 5–15)
BUN: 16 mg/dL (ref 6–20)
CO2: 26 mmol/L (ref 22–32)
Calcium: 9.2 mg/dL (ref 8.9–10.3)
Chloride: 100 mmol/L (ref 98–111)
Creatinine, Ser: 1.4 mg/dL — ABNORMAL HIGH (ref 0.61–1.24)
GFR calc Af Amer: >60 mL/min (ref >60)
GFR calc non Af Amer: 55 mL/min — ABNORMAL LOW (ref >60)
Glucose, Bld: 169 mg/dL — ABNORMAL HIGH (ref 70–99)
Potassium: 2.7 mmol/L — CL (ref 3.5–5.1)
Sodium: 142 mmol/L (ref 135–145)

## 2020-03-12 LAB — BRAIN NATRIURETIC PEPTIDE: B Natriuretic Peptide: 49.9 pg/mL (ref 0.0–100.0)

## 2020-03-12 LAB — TROPONIN I (HIGH SENSITIVITY)
Troponin I (High Sensitivity): 21 ng/L — ABNORMAL HIGH (ref <18)
Troponin I (High Sensitivity): 53 ng/L — ABNORMAL HIGH (ref <18)

## 2020-03-12 LAB — MAGNESIUM: Magnesium: 1.7 mg/dL (ref 1.7–2.4)

## 2020-03-12 LAB — SARS CORONAVIRUS 2 BY RT PCR (HOSPITAL ORDER, PERFORMED IN ~~LOC~~ HOSPITAL LAB): SARS Coronavirus 2: NEGATIVE

## 2020-03-12 MED ORDER — FAMOTIDINE IN NACL 20-0.9 MG/50ML-% IV SOLN
20.0000 mg | Freq: Once | INTRAVENOUS | Status: AC
  Administered 2020-03-12: 20 mg via INTRAVENOUS
  Filled 2020-03-12: qty 50

## 2020-03-12 MED ORDER — LACTATED RINGERS IV BOLUS
1000.0000 mL | Freq: Once | INTRAVENOUS | Status: AC
  Administered 2020-03-12: 1000 mL via INTRAVENOUS

## 2020-03-12 MED ORDER — POTASSIUM CHLORIDE CRYS ER 20 MEQ PO TBCR
40.0000 meq | EXTENDED_RELEASE_TABLET | Freq: Once | ORAL | Status: AC
  Administered 2020-03-12: 40 meq via ORAL
  Filled 2020-03-12: qty 2

## 2020-03-12 MED ORDER — SODIUM CHLORIDE 0.9 % IV BOLUS: 1000.0000 mL | Freq: Once | INTRAVENOUS | Status: DC

## 2020-03-12 MED ORDER — EPINEPHRINE 0.3 MG/0.3ML IJ SOAJ: 0.3000 mg | INTRAMUSCULAR | 0 refills | Status: AC | PRN

## 2020-03-12 MED ORDER — ONDANSETRON HCL 4 MG/2ML IJ SOLN: 4.0000 mg | Freq: Once | INTRAMUSCULAR | Status: DC

## 2020-03-12 MED ORDER — ONDANSETRON HCL 4 MG/2ML IJ SOLN
4.0000 mg | Freq: Once | INTRAMUSCULAR | Status: AC
  Administered 2020-03-12: 4 mg via INTRAVENOUS

## 2020-03-12 MED ORDER — METHYLPREDNISOLONE SODIUM SUCC 125 MG IJ SOLR
125.0000 mg | Freq: Once | INTRAMUSCULAR | Status: AC
  Administered 2020-03-12: 125 mg via INTRAVENOUS
  Filled 2020-03-12: qty 2

## 2020-03-12 NOTE — ED Provider Notes (Signed)
[facility] EMERGENCY DEPARTMENT Provider Note   CSN: [id number] Arrival date & time: 03/12/20  1657     History Chief Complaint  Patient presents with  . Respiratory Distress    Keith Stone is a 59 y.o. male.  EMS called out for syncope. Patietn recalls feeling itchy all over, LOCx3, diarrhea, and difficulty breathing. Hypoxic on EMS arrival, placed on NRB, still hypoxic to 80s so transitioned to CPAP. Patient has required BiPAP before for asthma. Received epi, benadryl, albuterol, 500cc IVF with EMS.  Of note, patient has had tick bites in the past and ate red meat for lunch for the first time in a while. This was 3-4 hours prior to symptom onset. No other ingestions or exposures within the hour prior to symptom onset. His mother has alpha gal reactions after tick bites.    Shortness of Breath Severity:  Severe Timing:  Constant Progression:  Worsening Context: not known allergens   Relieved by:  Nothing Worsened by:  Nothing Associated symptoms: vomiting   Associated symptoms: no abdominal pain, no chest pain, no cough, no ear pain, no fever, no rash, no sore throat and no wheezing        Past Medical History:  Diagnosis Date  . Adenomatous colon polyp 08/26/2011   HYPERPLASTIC POLYP (MULTIPLE FRAGMENTS AND TUBULAR ADENOMA (ONE FRAGMENT)  . Allergic rhinitis   . Allergy   . Asthma    as child, none now  . Complication of anesthesia 20 years ago   woke up with mouth oral surgery  . DJD (degenerative joint disease)   . GERD (gastroesophageal reflux disease)    occ uses alka seltzer  . Hypertension   . Insomnia   . Obstructive sleep apnea    on CPAP setting of 8 uses once or twice a month  . Palpitations   . Prostate cancer ([place])   . Sleep apnea    no cpap     Patient Active Problem List   Diagnosis Date Noted  . Prostate cancer ([place]) 02/22/2014  . Occult blood in stools 09/07/2013  . Nausea alone 09/07/2013  . LUQ discomfort  09/07/2013  . Obstructive sleep apnea 07/29/2011    Past Surgical History:  Procedure Laterality Date  . BILATERAL KNEE ARTHROSCOPY  [id number]  . COLONOSCOPY    . LYMPHADENECTOMY Bilateral 02/22/2014   Procedure: PELVIC LYMPH NODE DISSECTION;  Surgeon: Bernestine Amass, MD;  Location: [facility] ORS;  Service: Urology;  Laterality: Bilateral;  . MOUTH SURGERY  20 years ago  . POLYPECTOMY    . PROSTATE BIOPSY  November 22, 2013  . ROBOT ASSISTED LAPAROSCOPIC RADICAL PROSTATECTOMY N/A 02/22/2014   Procedure: ROBOTIC ASSISTED LAPAROSCOPIC RADICAL PROSTATECTOMY;  Surgeon: Bernestine Amass, MD;  Location: [facility];  Service: Urology;  Laterality: N/A;  . TONSILLECTOMY  as child  . TYMPANOSTOMY TUBE PLACEMENT     several times   . VASECTOMY  2000       Family History  Problem Relation Age of Onset  . Arthritis Mother   . Fibromyalgia Mother   . Coronary artery disease Maternal Grandmother   . Coronary artery disease Maternal Grandfather   . Cancer Maternal Grandfather   . Hypertension Brother   . Colon polyps Brother   . Skin cancer Brother   . Colon polyps Father   . Lung cancer Father   . Lymphoma Paternal Uncle   . Colon cancer Neg Hx   . Esophageal cancer Neg Hx   .  Stomach cancer Neg Hx   . Rectal cancer Neg Hx     Social History   Tobacco Use  . Smoking status: Current Every Day Smoker    Packs/day: 1.00    Years: 30.00    Pack years: 30.00    Types: E-cigarettes  . Smokeless tobacco: Never Used  . Tobacco comment: quit cigarettes 2 years ago 1 and 1/2 ppd x 30 years, now e cigarette  Vaping Use  . Vaping Use: Never used  Substance Use Topics  . Alcohol use: Yes    Comment: social only  . Drug use: No    Home Medications Prior to Admission medications   Medication Sig Start Date End Date Taking? Authorizing Provider  carvedilol (COREG) 12.5 MG tablet Take 12.5 mg by mouth 2 (two) times daily with a meal.    [provider]  EPINEPHrine 0.3 mg/0.3 mL IJ SOAJ injection  Inject 0.3 mg into the muscle as needed for anaphylaxis. 03/12/20   [name], MD  felodipine (PLENDIL) 10 MG 24 hr tablet Take 10 mg by mouth daily.    [provider]  HYDROcodone-acetaminophen (NORCO/VICODIN) 5-325 MG per tablet Take 1-2 tablets by mouth every 6 (six) hours as needed for moderate pain. 02/22/14   Debbrah Alar, PA-C  losartan-hydrochlorothiazide (HYZAAR) 100-25 MG per tablet Take 0.5 tablets by mouth 2 (two) times daily.     [provider]  zolpidem (AMBIEN) 10 MG tablet Take 10 mg by mouth at bedtime as needed.    [provider]    Allergies    Patient has no known allergies.  Review of Systems   Review of Systems  Constitutional: Negative for chills and fever.  HENT: Negative for ear pain and sore throat.   Eyes: Negative for pain and visual disturbance.  Respiratory: Positive for shortness of breath. Negative for cough and wheezing.   Cardiovascular: Negative for chest pain and palpitations.  Gastrointestinal: Positive for diarrhea, nausea and vomiting. Negative for abdominal pain.  Genitourinary: Negative for dysuria and hematuria.  Musculoskeletal: Negative for arthralgias and back pain.  Skin: Negative for color change and rash.  Neurological: Negative for seizures and syncope.  All other systems reviewed and are negative.   Physical Exam Updated Vital Signs BP 111/69 (BP Location: Left Arm)   Pulse 75   Temp 98.1 F (36.7 C) (Oral)   Resp 16   Ht 6\' 1"  (1.854 m)   Wt 111 kg   SpO2 99%   BMI 32.29 kg/m   Physical Exam Vitals and nursing note reviewed.  Constitutional:      Appearance: He is well-developed.     Comments: Drowsy  HENT:     Head: Normocephalic and atraumatic.  Eyes:     Conjunctiva/sclera: Conjunctivae normal.  Cardiovascular:     Rate and Rhythm: Normal rate and regular rhythm.     Heart sounds: No murmur heard.   Pulmonary:     Effort: Respiratory distress present.     Breath sounds: No  wheezing.  Abdominal:     Palpations: Abdomen is soft.     Tenderness: There is no abdominal tenderness.  Musculoskeletal:     Cervical back: Neck supple.     Right lower leg: No edema.     Left lower leg: No edema.  Skin:    General: Skin is warm and dry.     Comments: Diffuse skin erythema  Neurological:     General: No focal deficit present.  Mental Status: He is oriented to person, place, and time.     ED Results / Procedures / Treatments   Labs (all labs ordered are listed, but only abnormal results are displayed) Labs Reviewed  BASIC METABOLIC PANEL - Abnormal; Notable for the following components:      Result Value   Potassium 2.7 (*)    Glucose, Bld 169 (*)    Creatinine, Ser 1.40 (*)    GFR calc non Af Amer 55 (*)    Anion gap 16 (*)    All other components within normal limits  CBC WITH DIFFERENTIAL/PLATELET - Abnormal; Notable for the following components:   WBC 13.9 (*)    Neutro Abs 10.9 (*)    All other components within normal limits  I-STAT VENOUS BLOOD GAS, ED - Abnormal; Notable for the following components:   Potassium 2.7 (*)    Calcium, Ion 1.13 (*)    All other components within normal limits  TROPONIN I (HIGH SENSITIVITY) - Abnormal; Notable for the following components:   Troponin I (High Sensitivity) 21 (*)    All other components within normal limits  TROPONIN I (HIGH SENSITIVITY) - Abnormal; Notable for the following components:   Troponin I (High Sensitivity) 53 (*)    All other components within normal limits  SARS CORONAVIRUS 2 BY RT PCR (HOSPITAL ORDER, [facility])  BRAIN NATRIURETIC PEPTIDE  MAGNESIUM    EKG None  Radiology DG Chest Port 1 View  Result Date: 03/12/2020 CLINICAL DATA:  Short of breath, respiratory distress EXAM: PORTABLE CHEST 1 VIEW COMPARISON:  02/16/2014 FINDINGS: The heart size and mediastinal contours are within normal limits. Both lungs are clear. The visualized skeletal  structures are unremarkable. IMPRESSION: No active disease. Electronically Signed   By: Randa Ngo M.D.   On: 03/12/2020 17:21    Procedures Procedures (including critical care time)  Medications Ordered in ED Medications  sodium chloride 0.9 % bolus 1,000 mL (1,000 mLs Intravenous Not Given 03/12/20 1730)  ondansetron (ZOFRAN) injection 4 mg (4 mg Intravenous Not Given 03/12/20 1736)  methylPREDNISolone sodium succinate (SOLU-MEDROL) 125 mg/2 mL injection 125 mg (125 mg Intravenous Given 03/12/20 1730)  famotidine (PEPCID) IVPB 20 mg premix (0 mg Intravenous Stopped 03/12/20 1811)  lactated ringers bolus 1,000 mL (0 mLs Intravenous Stopped 03/12/20 1901)  ondansetron (ZOFRAN) injection 4 mg (4 mg Intravenous Given 03/12/20 1737)  potassium chloride SA (KLOR-CON) CR tablet 40 mEq (40 mEq Oral Given 03/12/20 1945)    ED Course  I have reviewed the triage vital signs and the nursing notes.  Pertinent labs & imaging results that were available during my care of the patient were reviewed by me and considered in my medical decision making (see chart for details).    MDM Rules/Calculators/A&P                          Patient able to transition from BiPAP to nasal canula to room air while maintaining SPO2 in 90s. Ambulated without difficult, increased respiratory effort, or hypoxia. BP improved after fluids.   Symptoms and clinical course consistent with anaphylaxis. No concern for asthma exacerbation, PNA, COVID at this time. Suspect alpha gal reaction. Referred to allergy clinic for testing. Patient counseled on avoiding red meat, returning tonight for any return of symptoms, and using an epipen in case of exposure with anaphylactic symptoms in the future. He and his wife verbalized understanding and agreement with this  plan. Safe for discharge home at this time.    Final Clinical Impression(s) / ED Diagnoses Final diagnoses:  Anaphylaxis, initial encounter    Rx / DC Orders ED Discharge  Orders         Ordered    Ambulatory referral to Allergy        03/12/20 2108    EPINEPHrine 0.3 mg/0.3 mL IJ SOAJ injection  As needed        03/12/20 2130           [name], MD 03/12/20 2349    Isla Pence, MD 03/13/20 1539

## 2020-03-12 NOTE — ED Notes (Signed)
Pt preparing to vomit, BiPAP removed by this RN. EDP made aware.

## 2020-03-12 NOTE — Discharge Instructions (Signed)
You have been referred to an allergist. Please call to schedule an appointment.   Please pick up your epipen in case of reaction. Return to the emergency department with any concerning symptoms.

## 2020-03-12 NOTE — ED Notes (Signed)
Pt oxygen turn off to see how he maintains without it. O2 saturation stayed at 98%. Pt reports no dyspnea or shortness of breath.

## 2020-03-12 NOTE — ED Notes (Signed)
Date and time results received: 03/12/20    Test: Potassium  Critical Value: 2.7  Name of Provider Notified: Dr. Gilford Raid  Orders Received? Or Actions Taken?: No new orders

## 2020-03-12 NOTE — ED Triage Notes (Signed)
Pt BIB [facility] from home as Resp Distress.   Wife called EMS stating pt had a syncopal episode and was "itching all over".  Medic gave 0.3 IM Epi and EMS upon arrival gave: 50 mg PO Benadryl 5 mg Albuterol Neb And 500 ml Bolus of NS  Pt currently A&Ox4, GCS 15 but is lethargic and responds to voice.   With EMS pt was hypotensive at 100/60 and hypoxic in 80s on 15LNRB. Pt then placed on CPAP.

## 2020-03-12 NOTE — ED Notes (Signed)
EDP made aware of 96.39F Rectal Temperature. Warm blankets applied

## 2020-03-12 NOTE — ED Notes (Signed)
Pt tolerated ambulating in the hallway well. No reports of dyspnea, SOB, or lightheadedness. SPO2 stayed at 95-96% while ambulating. Pt had steady gait.

## 2020-03-12 NOTE — ED Notes (Signed)
Troponin elevated to 53 from 21. EDP made aware, no further orders at this time.

## 2020-03-16 DIAGNOSIS — R0981 Nasal congestion: Secondary | ICD-10-CM | POA: Diagnosis not present

## 2020-04-02 NOTE — Progress Notes (Signed)
New Patient Note  RE: Keith Stone MRN: [id number] DOB: [birth year] Date of Office Visit: 04/03/2020  Referring provider: Isla Pence, MD Primary care provider: Prince Solian, MD  Chief Complaint: Allergic Reaction  History of Present Illness: I had the pleasure of seeing Keith Stone for initial evaluation at the [facility] on 04/03/2020. He is a 59 y.o. male, who is referred here by Prince Solian, MD for the evaluation of alpha gal allergy.  Patient has been bitten by 3 ticks over the summer and he usually does not eat red meat.   On Sunday he had a cheeseburger and on Monday he had another cheeseburger around lunch time. Around 3PM patient felt tired and tried to lay down at home but felt strange after 20 minutes.   Stood up and passed out about 5 times. He noticed body itching and breaking out in rashes.  He called his daughter who called 911 as patient passed out while on the phone with her.   He has been avoiding red meat completely and having very limited dairy. The milk does not seem to cause any issues.   Patient developed some shortness of breath before EMS arrived. He was treated with epinephrine, benadryl and oxygen with good benefit.  He was discharged after a few hours with resolution of his symptoms.   Denies any other changes in diet, medications or personal care products. No recent infections.  He does have access to epinephrine autoinjector and not needed to use it.   Patient's mother has alpha gal allergy as well.   Past work up includes: none. Dietary History: patient has been eating other foods including limited milk, eggs, peanut, treenuts, sesame, shellfish, fish, soy, wheat, meats, fruits and vegetables.  He reports reading labels and avoiding red meat in diet completely.   03/12/2020 ER visit: "Pt presents to the ED today with severe sob.  He called EMS.  Pt appeared to be anaphylactic, so he was given epi and  benadryl and put on cpap.  O2 sats are only in the low 80s upon arrival.  Pt has had Covid, but not been vaccinated.  Pt put on our bipap and O2 sats are in the upper 90s.  He was given solumedrol and pepcid.  He was weaned off the oxygen as he has started to feel better.  He is now on no oxygen and is able to ambulate without any difficulty.  There is a question of alpha gal allergy, so pt is told to avoid meat.  His troponins are slightly elevated, but likely due to severe sob.  He denies cp.  Pt's K is low and that is replaced.  Covid neg. Pt was watched for several hours and wants to go home.  Pt knows to return if worse.  EMS called out for syncope. Patietn recalls feeling itchy all over, LOCx3, diarrhea, and difficulty breathing. Hypoxic on EMS arrival, placed on NRB, still hypoxic to 80s so transitioned to CPAP. Patient has required BiPAP before for asthma. Received epi, benadryl, albuterol, 500cc IVF with EMS.  Of note, patient has had tick bites in the past and ate red meat for lunch for the first time in a while. This was 3-4 hours prior to symptom onset. No other ingestions or exposures within the hour prior to symptom onset. His mother has alpha gal reactions after tick bites."  Assessment and Plan: Keith Stone is a 59 y.o. male with: Allergy with anaphylaxis due to food, subsequent  encounter Anaphylactic reaction after eating a cheeseburger - whole body pruritus, rash, shortness of breath and lost of consciousness requiring ER visit with epinephrine, benadryl and oxygen. 3 recent tick bites. Mother has alpha-gal. Tolerates small amounts of milk with no issues.   Today's skin testing showed: Positive to beef, pork, lamb. Negative milk and casein.   Patient most likely has alpha-gal allergy given clinical history.   Handout given on alpha-gal allergy.  Continue strict avoidance of all red meat - beef, pork, lamb, venison.  For mild symptoms you can take over the counter antihistamines such  as Benadryl and monitor symptoms closely. If symptoms worsen or if you have severe symptoms including breathing issues, throat closure, significant swelling, whole body hives, severe diarrhea and vomiting, lightheadedness then inject epinephrine and seek immediate medical care afterwards.  Food action plan given. . Get bloodwork for alpha gal.  . Okay to consume small amounts of dairy in coffee as before.  . May try cheese at home as well.   Chronic rhinitis Mild rhinitis symptoms in the spring and takes over-the-counter antihistamines with good benefit.  May use over the counter antihistamines such as Zyrtec (cetirizine), Claritin (loratadine), Allegra (fexofenadine), or Xyzal (levocetirizine) daily as needed.  See below for pollen environmental control measures.  History of asthma Asthma since childhood and has rare flareups about once a year for which he uses albuterol with good benefit.  May use albuterol rescue inhaler 2 puffs every 4 to 6 hours as needed for shortness of breath, chest tightness, coughing, and wheezing. May use albuterol rescue inhaler 2 puffs 5 to 15 minutes prior to strenuous physical activities. Monitor frequency of use.   Return in about 1 year (around 04/03/2021).  Lab Orders     Alpha-Gal Panel  Other allergy screening: Asthma: as a child and occasionally flares up - uses albuterol once a year Rhino conjunctivitis: yes  Mild rhinitis symptoms in the spring and takes OTC antihistamines with good benefit.  Medication allergy: no Hymenoptera allergy: no Urticaria: no Eczema:no History of recurrent infections suggestive of immunodeficency: no  Diagnostics: Skin Testing: Select foods. Positive to beef, pork, lamb. Negative milk and casein.  Results discussed with patient/family.  Food Adult Perc - 04/03/20 1000    Time Antigen Placed 1005    Allergen Manufacturer Lavella Hammock    Location Back    Number of allergen test 7     Control-buffer 50% Glycerol  Negative    Control-Histamine 1 mg/ml 2+    5. Milk, cow Negative    7. Casein Negative    37. Pork --   5x4   40. Beef --   6x4   41. Arline Asp --   6x6          Past Medical History: Patient Active Problem List   Diagnosis Date Noted  . History of asthma 04/03/2020  . Chronic rhinitis 04/03/2020  . Allergy with anaphylaxis due to food, subsequent encounter 04/03/2020  . Prostate cancer ([place]) 02/22/2014  . Occult blood in stools 09/07/2013  . Nausea alone 09/07/2013  . LUQ discomfort 09/07/2013  . Obstructive sleep apnea 07/29/2011   Past Medical History:  Diagnosis Date  . Adenomatous colon polyp 08/26/2011   HYPERPLASTIC POLYP (MULTIPLE FRAGMENTS AND TUBULAR ADENOMA (ONE FRAGMENT)  . Allergic rhinitis   . Allergy   . Asthma    as child, none now  . Complication of anesthesia 20 years ago   woke up with mouth oral surgery  . DJD (degenerative  joint disease)   . GERD (gastroesophageal reflux disease)    occ uses alka seltzer  . Hypertension   . Insomnia   . Obstructive sleep apnea    on CPAP setting of 8 uses once or twice a month  . Palpitations   . Prostate cancer ([place])   . Sleep apnea    no cpap    Past Surgical History: Past Surgical History:  Procedure Laterality Date  . BILATERAL KNEE ARTHROSCOPY  [id number]  . COLONOSCOPY    . LYMPHADENECTOMY Bilateral 02/22/2014   Procedure: PELVIC LYMPH NODE DISSECTION;  Surgeon: Bernestine Amass, MD;  Location: [facility];  Service: Urology;  Laterality: Bilateral;  . MOUTH SURGERY  20 years ago  . POLYPECTOMY    . PROSTATE BIOPSY  November 22, 2013  . ROBOT ASSISTED LAPAROSCOPIC RADICAL PROSTATECTOMY N/A 02/22/2014   Procedure: ROBOTIC ASSISTED LAPAROSCOPIC RADICAL PROSTATECTOMY;  Surgeon: Bernestine Amass, MD;  Location: [facility];  Service: Urology;  Laterality: N/A;  . TONSILLECTOMY  as child  . TYMPANOSTOMY TUBE PLACEMENT     several times   . VASECTOMY  2000   Medication List:  Current Outpatient Medications  Medication Sig  Dispense Refill  . albuterol (VENTOLIN HFA) 108 (90 Base) MCG/ACT inhaler Inhale 2 puffs into the lungs every 6 (six) hours as needed for wheezing or shortness of breath.    Marland Kitchen aspirin 81 MG chewable tablet Chew 81 mg by mouth daily.    . carvedilol (COREG) 12.5 MG tablet Take 12.5 mg by mouth 2 (two) times daily with a meal.    . EPINEPHrine 0.3 mg/0.3 mL IJ SOAJ injection Inject 0.3 mg into the muscle as needed for anaphylaxis. 1 each 0  . felodipine (PLENDIL) 10 MG 24 hr tablet Take 10 mg by mouth daily.    Marland Kitchen HYDROcodone-acetaminophen (NORCO/VICODIN) 5-325 MG per tablet Take 1-2 tablets by mouth every 6 (six) hours as needed for moderate pain. 30 tablet 0  . losartan-hydrochlorothiazide (HYZAAR) 100-25 MG per tablet Take 0.5 tablets by mouth 2 (two) times daily.     . Omega-3 Fatty Acids (FISH OIL) 1200 MG CAPS Take by mouth.    . zolpidem (AMBIEN) 10 MG tablet Take 10 mg by mouth at bedtime as needed.     Current Facility-Administered Medications  Medication Dose Route Frequency Provider Last Rate Last Admin  . 0.9 %  sodium chloride infusion  500 mL Intravenous Once Ladene Artist, MD       Allergies: No Known Allergies Social History: Social History   Socioeconomic History  . Marital status: Married    Spouse name: Not on file  . Number of children: 3  . Years of education: Not on file  . Highest education level: Not on file  Occupational History  . 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  Tobacco Use  . Smoking status: Current Every Day Smoker    Packs/day: 1.00    Years: 30.00    Pack years: 30.00    Types: E-cigarettes, Cigarettes  . Smokeless tobacco: Never Used  . Tobacco comment: quit cigarettes 2 years ago 1 and 1/2 ppd x 30 years, now e cigarette  Vaping Use  . Vaping Use: Never used  Substance and Sexual Activity  . Alcohol use: Yes    Comment: social only  . Drug use: No  . Sexual activity: Yes  Other Topics Concern  . Not on file  Social History  Narrative  . Not on file  Social Determinants of Health   Financial Resource Strain:   . Difficulty of Paying Living Expenses: Not on file  Food Insecurity:   . Worried About Charity fundraiser in the Last Year: Not on file  . Ran Out of Food in the Last Year: Not on file  Transportation Needs:   . Lack of Transportation (Medical): Not on file  . Lack of Transportation (Non-Medical): Not on file  Physical Activity:   . Days of Exercise per Week: Not on file  . Minutes of Exercise per Session: Not on file  Stress:   . Feeling of Stress : Not on file  Social Connections:   . Frequency of Communication with Friends and Family: Not on file  . Frequency of Social Gatherings with Friends and Family: Not on file  . Attends Religious Services: Not on file  . Active Member of Clubs or Organizations: Not on file  . Attends Archivist Meetings: Not on file  . Marital Status: Not on file   Lives in a 59 year old home. Smoking: 1 pack per day x 20 years Occupation: Haematologist History: Water Damage/mildew in the house: no Charity fundraiser in the family room: no Carpet in the bedroom: yes Heating: electric Cooling: central Pet: yes 1 dog x 10 yrs, 1 cat x 10 yrs  Family History: Family History  Problem Relation Age of Onset  . Arthritis Mother   . Fibromyalgia Mother   . Food Allergy Mother   . Coronary artery disease Maternal Grandmother   . Coronary artery disease Maternal Grandfather   . Cancer Maternal Grandfather   . Hypertension Brother   . Colon polyps Brother   . Skin cancer Brother   . Colon polyps Father   . Lung cancer Father   . Lymphoma Paternal Uncle   . Colon cancer Neg Hx   . Esophageal cancer Neg Hx   . Stomach cancer Neg Hx   . Rectal cancer Neg Hx   . Allergic rhinitis Neg Hx   . Angioedema Neg Hx   . Asthma Neg Hx   . Eczema Neg Hx   . Immunodeficiency Neg Hx   . Urticaria Neg Hx    Review of Systems  Constitutional: Negative for  appetite change, chills, fever and unexpected weight change.  HENT: Negative for congestion and rhinorrhea.   Eyes: Negative for itching.  Respiratory: Negative for cough, chest tightness, shortness of breath and wheezing.   Cardiovascular: Negative for chest pain.  Gastrointestinal: Negative for abdominal pain.  Genitourinary: Negative for difficulty urinating.  Skin: Negative for rash.  Allergic/Immunologic: Positive for food allergies.  Neurological: Negative for headaches.   Objective: BP 110/72   Pulse 72   Temp (!) 97.3 F (36.3 C) (Oral)   Resp 16   Ht 6\' 2"  (1.88 m)   Wt 245 lb (111.1 kg)   SpO2 96%   BMI 31.46 kg/m  Body mass index is 31.46 kg/m. Physical Exam Vitals and nursing note reviewed.  Constitutional:      Appearance: Normal appearance. He is well-developed.  HENT:     Head: Normocephalic and atraumatic.     Right Ear: External ear normal.     Left Ear: External ear normal.     Nose: Nose normal.     Mouth/Throat:     Mouth: Mucous membranes are moist.     Pharynx: Oropharynx is clear.  Eyes:     Conjunctiva/sclera: Conjunctivae normal.  Cardiovascular:  Rate and Rhythm: Normal rate and regular rhythm.     Heart sounds: Normal heart sounds. No murmur heard.  No friction rub. No gallop.   Pulmonary:     Effort: Pulmonary effort is normal.     Breath sounds: Normal breath sounds. No wheezing, rhonchi or rales.  Musculoskeletal:     Cervical back: Neck supple.  Skin:    General: Skin is warm.     Findings: No rash.  Neurological:     Mental Status: He is alert and oriented to person, place, and time.  Psychiatric:        Behavior: Behavior normal.    The plan was reviewed with the patient/family, and all questions/concerned were addressed.  It was my pleasure to see Keith Stone today and participate in his care. Please feel free to contact me with any questions or concerns.  Sincerely,  Rexene Alberts, DO Allergy & Immunology  [facility] office: [place] office: [phone]

## 2020-04-03 ENCOUNTER — Encounter: Payer: Self-pay | Admitting: Allergy

## 2020-04-03 ENCOUNTER — Other Ambulatory Visit: Payer: Self-pay

## 2020-04-03 ENCOUNTER — Ambulatory Visit (INDEPENDENT_AMBULATORY_CARE_PROVIDER_SITE_OTHER): Payer: BC Managed Care – PPO | Admitting: Allergy

## 2020-04-03 VITALS — BP 110/72 | HR 72 | Temp 97.3°F | Resp 16 | Ht 74.0 in | Wt 245.0 lb

## 2020-04-03 DIAGNOSIS — Z8546 Personal history of malignant neoplasm of prostate: Secondary | ICD-10-CM | POA: Diagnosis not present

## 2020-04-03 DIAGNOSIS — Z8709 Personal history of other diseases of the respiratory system: Secondary | ICD-10-CM

## 2020-04-03 DIAGNOSIS — E291 Testicular hypofunction: Secondary | ICD-10-CM | POA: Diagnosis not present

## 2020-04-03 DIAGNOSIS — T7800XD Anaphylactic reaction due to unspecified food, subsequent encounter: Secondary | ICD-10-CM

## 2020-04-03 DIAGNOSIS — J310 Chronic rhinitis: Secondary | ICD-10-CM

## 2020-04-03 NOTE — Patient Instructions (Addendum)
Today's skin testing showed: Positive to beef, pork, lamb. Negative milk and casein.    Handout given on alpha-gal allergy.  Continue strict avoidance of all red meat - beef, pork, lamb, venison.  For mild symptoms you can take over the counter antihistamines such as Benadryl and monitor symptoms closely. If symptoms worsen or if you have severe symptoms including breathing issues, throat closure, significant swelling, whole body hives, severe diarrhea and vomiting, lightheadedness then inject epinephrine and seek immediate medical care afterwards.  Food action plan given.  . Get bloodwork. Faythe Ghee to consume small amounts of dairy in your coffee as before.  . You may try cheese at home as well.  o We are ordering labs, so please allow 1-2 weeks for the results to come back. o With the newly implemented Cures Act, the labs might be visible to you at the same time that they become visible to me. However, I will not address the results until all of the results are back, so please be patient.  o In the meantime, continue recommendations in your patient instructions, including avoidance measures (if applicable), until you hear from me.  Breathing:  May use albuterol rescue inhaler 2 puffs every 4 to 6 hours as needed for shortness of breath, chest tightness, coughing, and wheezing. May use albuterol rescue inhaler 2 puffs 5 to 15 minutes prior to strenuous physical activities. Monitor frequency of use.   Environmental allergies:  May use over the counter antihistamines such as Zyrtec (cetirizine), Claritin (loratadine), Allegra (fexofenadine), or Xyzal (levocetirizine) daily as needed.  See below for pollen environmental control measures.  Follow up in 6-12 months or sooner if needed.  Reducing Pollen Exposure . Pollen seasons: trees (spring), grass (summer) and ragweed/weeds (fall). Marland Kitchen Keep windows closed in your home and car to lower pollen exposure.  Susa Simmonds air conditioning in the  bedroom and throughout the house if possible.  . Avoid going out in dry windy days - especially early morning. . Pollen counts are highest between 5 - 10 AM and on dry, hot and windy days.  . Save outside activities for late afternoon or after a heavy rain, when pollen levels are lower.  . Avoid mowing of grass if you have grass pollen allergy. Marland Kitchen Be aware that pollen can also be transported indoors on people and pets.  . Dry your clothes in an automatic dryer rather than hanging them outside where they might collect pollen.  . Rinse hair and eyes before bedtime.

## 2020-04-03 NOTE — Assessment & Plan Note (Signed)
Mild rhinitis symptoms in the spring and takes over-the-counter antihistamines with good benefit.  May use over the counter antihistamines such as Zyrtec (cetirizine), Claritin (loratadine), Allegra (fexofenadine), or Xyzal (levocetirizine) daily as needed.  See below for pollen environmental control measures.

## 2020-04-03 NOTE — Assessment & Plan Note (Signed)
Asthma since childhood and has rare flareups about once a year for which he uses albuterol with good benefit.  May use albuterol rescue inhaler 2 puffs every 4 to 6 hours as needed for shortness of breath, chest tightness, coughing, and wheezing. May use albuterol rescue inhaler 2 puffs 5 to 15 minutes prior to strenuous physical activities. Monitor frequency of use.

## 2020-04-03 NOTE — Assessment & Plan Note (Addendum)
Anaphylactic reaction after eating a cheeseburger - whole body pruritus, rash, shortness of breath and lost of consciousness requiring ER visit with epinephrine, benadryl and oxygen. 3 recent tick bites. Mother has alpha-gal. Tolerates small amounts of milk with no issues.   Today's skin testing showed: Positive to beef, pork, lamb. Negative milk and casein.   Patient most likely has alpha-gal allergy given clinical history.   Handout given on alpha-gal allergy.  Continue strict avoidance of all red meat - beef, pork, lamb, venison.  For mild symptoms you can take over the counter antihistamines such as Benadryl and monitor symptoms closely. If symptoms worsen or if you have severe symptoms including breathing issues, throat closure, significant swelling, whole body hives, severe diarrhea and vomiting, lightheadedness then inject epinephrine and seek immediate medical care afterwards.  Food action plan given. . Get bloodwork for alpha gal.  . Okay to consume small amounts of dairy in coffee as before.  . May try cheese at home as well.

## 2020-04-09 DIAGNOSIS — E291 Testicular hypofunction: Secondary | ICD-10-CM | POA: Diagnosis not present

## 2020-04-09 DIAGNOSIS — N393 Stress incontinence (female) (male): Secondary | ICD-10-CM | POA: Diagnosis not present

## 2020-04-09 DIAGNOSIS — Z8546 Personal history of malignant neoplasm of prostate: Secondary | ICD-10-CM | POA: Diagnosis not present

## 2020-04-12 ENCOUNTER — Telehealth: Payer: Self-pay

## 2020-04-12 LAB — ALPHA-GAL PANEL
Alpha Gal IgE*: 20.8 kU/L — ABNORMAL HIGH (ref <0.10)
Beef (Bos spp) IgE: 17.5 kU/L — ABNORMAL HIGH (ref <0.35)
Class Interpretation: 3
Class Interpretation: 3
Class Interpretation: 4
Lamb/Mutton (Ovis spp) IgE: 8.14 kU/L — ABNORMAL HIGH (ref <0.35)
Pork (Sus spp) IgE: 12.7 kU/L — ABNORMAL HIGH (ref <0.35)

## 2020-04-12 NOTE — Telephone Encounter (Signed)
Pt is aware of results. 

## 2020-04-12 NOTE — Telephone Encounter (Signed)
-----   Message from Garnet Sierras, DO sent at 04/12/2020  8:12 AM EDT ----- Please call patient. Alpha-gal bloodwork was positive.   Continue strict avoidance of all red meat - beef, pork, lamb, venison. Okay to eat dairy products, if noticing any symptoms afterwards then recommend avoiding as there is some cross reactivity between red meat and cow's milk.

## 2020-04-12 NOTE — Progress Notes (Signed)
Pt is aware.  

## 2020-07-31 ENCOUNTER — Encounter: Payer: Self-pay | Admitting: Gastroenterology

## 2020-10-04 IMAGING — DX DG CHEST 1V PORT
1 series · 1 of 1 positions shown · non-contrast
Comparison: 02/16/2014

CLINICAL DATA: Short of breath, respiratory distress

EXAM:
PORTABLE CHEST 1 VIEW

[chest ap]
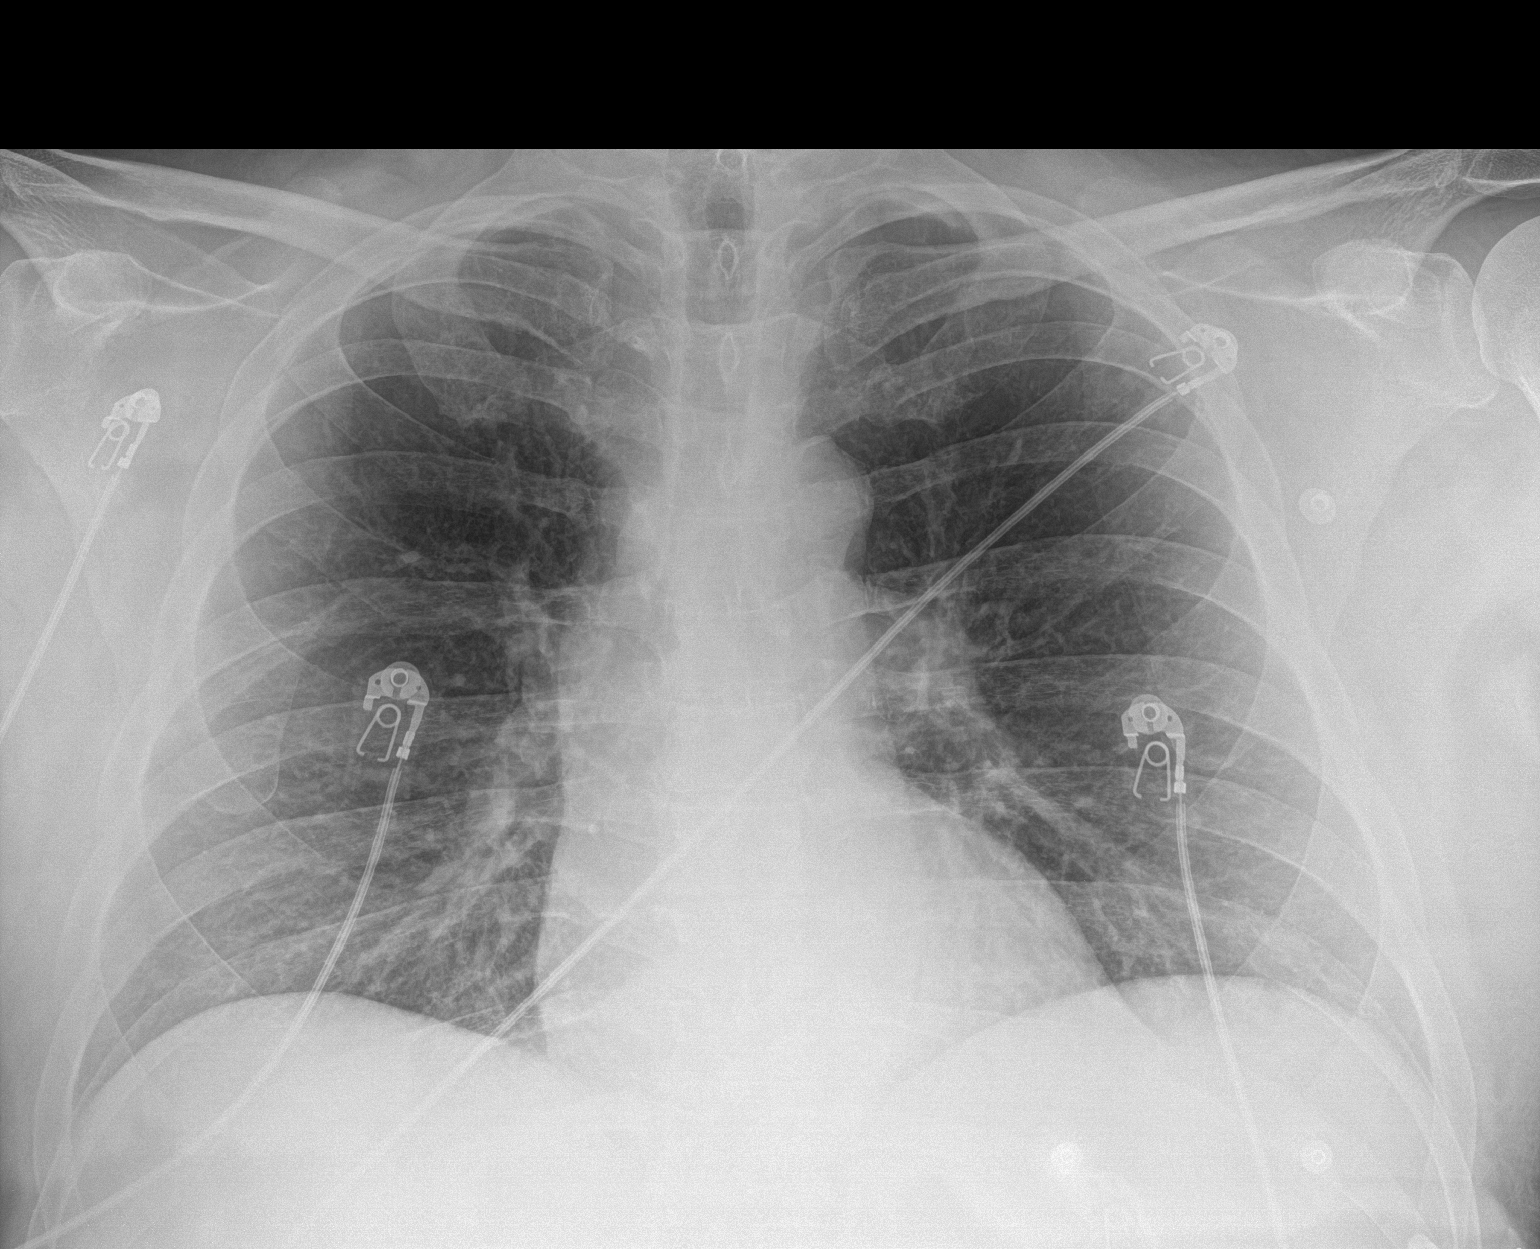

[1 of 1 positions shown; findings below may reference images not displayed]

FINDINGS: The heart size and mediastinal contours are within normal limits.
Both lungs are clear. The visualized skeletal structures are
unremarkable.
IMPRESSION: No active disease.

## 2020-10-10 DIAGNOSIS — Z125 Encounter for screening for malignant neoplasm of prostate: Secondary | ICD-10-CM | POA: Diagnosis not present

## 2020-10-10 DIAGNOSIS — E291 Testicular hypofunction: Secondary | ICD-10-CM | POA: Diagnosis not present

## 2020-10-10 DIAGNOSIS — E785 Hyperlipidemia, unspecified: Secondary | ICD-10-CM | POA: Diagnosis not present

## 2020-10-10 DIAGNOSIS — Z0000 Encounter for general adult medical examination without abnormal findings: Secondary | ICD-10-CM | POA: Diagnosis not present

## 2020-10-19 DIAGNOSIS — Z0000 Encounter for general adult medical examination without abnormal findings: Secondary | ICD-10-CM | POA: Diagnosis not present

## 2020-10-19 DIAGNOSIS — I10 Essential (primary) hypertension: Secondary | ICD-10-CM | POA: Diagnosis not present

## 2020-10-19 DIAGNOSIS — R82998 Other abnormal findings in urine: Secondary | ICD-10-CM | POA: Diagnosis not present

## 2020-10-19 LAB — IFOBT (OCCULT BLOOD): IFOBT: NEGATIVE

## 2020-11-16 DIAGNOSIS — L03317 Cellulitis of buttock: Secondary | ICD-10-CM | POA: Diagnosis not present

## 2020-11-16 DIAGNOSIS — L0231 Cutaneous abscess of buttock: Secondary | ICD-10-CM | POA: Diagnosis not present

## 2020-12-10 ENCOUNTER — Telehealth: Payer: Self-pay

## 2020-12-10 NOTE — Telephone Encounter (Signed)
-----   Message from Ladene Artist, MD sent at 12/10/2020 11:12 AM EDT ----- Please contact this patient about scheduling his surveillance colonoscopy which was due in 06/2020. I received an office note from Dr. Dagmar Hait, his PCP, noting that it was time to set up his colonoscopy.

## 2020-12-10 NOTE — Telephone Encounter (Signed)
Left message for patient to return my call.

## 2020-12-12 NOTE — Telephone Encounter (Signed)
Patient scheduled recall colon for 03/01/21 at 11:00am and pre-visit for 02/22/21 at 10:00am

## 2021-02-15 NOTE — Progress Notes (Signed)
Cardiology Office Note:    Date:  02/19/2021   ID:  Keith Stone, DOB 1961/06/17, MRN [id number]  PCP:  Keith Solian, MD   [facility] Providers Cardiologist:  None     Referring MD: Keith Solian, MD    History of Present Illness:    Keith Stone is a 60 y.o. male with a hx of asthma, GERD, hypertension, OSA, palpitations, and prostate cancer, here for the evaluation of palpitations, ill-defined history of atrial fibrillation with worsening OSA at the request of Dr. Dagmar Stone.  Had prior colon cancer, followed by Dr. Fuller Stone.  He presented to the ED 03/12/2020 with severe shortness of breath post syncope and appeared to be anaphylactic. Upon arrival his O2 sats were in the low 80s and his potassium was low. A reaction to alpha gal was suspected.  Today, he comes in for palpitations.  Possible atrial fibrillation.  Needs further evaluation. Worse at night, feels it stop and bounce. Been present for 30 years.  Both he and his son have been going to the gym about 3 days a week recently.  Father MI 24, lived about 10 Mother and brother ok GF died in his arm in 26's,   Tob use, quit once for 4 years. Wife also Wellbutrin. No ETOH  LDL 86 hemoglobin 16.7 creatinine 1.4 ALT 19 TSH 1.4 platelet count 348 from outside labs.  Outside notes extensively reviewed.  He denies chest Stone, or shortness of breath. No lightheadedness, headaches, syncope, orthopnea, or PND. Also has no lower extremity edema or exertional symptoms.   Past Medical History:  Diagnosis Date   Adenomatous colon polyp 08/26/2011   HYPERPLASTIC POLYP (MULTIPLE FRAGMENTS AND TUBULAR ADENOMA (ONE FRAGMENT)   Allergic rhinitis    Allergy    Asthma    as child, none now   Complication of anesthesia 20 years ago   woke up with mouth oral surgery   DJD (degenerative joint disease)    GERD (gastroesophageal reflux disease)    occ uses alka seltzer   Hypertension    Insomnia    Obstructive sleep apnea     on CPAP setting of 8 uses once or twice a month   Palpitations    Prostate cancer ([place])    Sleep apnea    no cpap    Tobacco use 02/19/2021    Past Surgical History:  Procedure Laterality Date   BILATERAL KNEE ARTHROSCOPY  [id number]   COLONOSCOPY     LYMPHADENECTOMY Bilateral 02/22/2014   Procedure: PELVIC LYMPH NODE DISSECTION;  Surgeon: Keith Amass, MD;  Location: [facility];  Service: Urology;  Laterality: Bilateral;   MOUTH SURGERY  20 years ago   POLYPECTOMY     PROSTATE BIOPSY  November 22, 2013   ROBOT ASSISTED LAPAROSCOPIC RADICAL PROSTATECTOMY N/A 02/22/2014   Procedure: ROBOTIC ASSISTED LAPAROSCOPIC RADICAL PROSTATECTOMY;  Surgeon: Keith Amass, MD;  Location: [facility];  Service: Urology;  Laterality: N/A;   TONSILLECTOMY  as child   TYMPANOSTOMY TUBE PLACEMENT     several times    VASECTOMY  2000    Current Medications: Current Meds  Medication Sig   albuterol (VENTOLIN HFA) 108 (90 Base) MCG/ACT inhaler Inhale 2 puffs into the lungs every 6 (six) hours as needed for wheezing or shortness of breath.   aspirin 81 MG chewable tablet Chew 81 mg by mouth daily.   carvedilol (COREG) 12.5 MG tablet Take 12.5 mg by mouth 2 (two) times daily with a meal.  cetirizine (ZYRTEC) 10 MG tablet Take 1 tablet each day   EPINEPHrine 0.3 mg/0.3 mL IJ SOAJ injection Inject 0.3 mg into the muscle as needed for anaphylaxis.   felodipine (PLENDIL) 10 MG 24 hr tablet Take 10 mg by mouth daily.   HYDROcodone-acetaminophen (NORCO/VICODIN) 5-325 MG per tablet Take 1-2 tablets by mouth every 6 (six) hours as needed for moderate Stone.   losartan-hydrochlorothiazide (HYZAAR) 100-25 MG per tablet Take 0.5 tablets by mouth 2 (two) times daily.    Omega-3 Fatty Acids (FISH OIL) 1200 MG CAPS Take by mouth.   zolpidem (AMBIEN) 10 MG tablet Take 10 mg by mouth at bedtime as needed.   Current Facility-Administered Medications for the 02/19/21 encounter (Office Visit) with Keith Pain, MD  Medication   0.9 %   sodium chloride infusion     Allergies:   Patient has no known allergies.   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  Tobacco Use   Smoking status: Every Day    Packs/day: 1.00    Years: 30.00    Pack years: 30.00    Types: E-cigarettes, Cigarettes   Smokeless tobacco: Never   Tobacco comments:    quit cigarettes 2 years ago 1 and 1/2 ppd x 30 years, now e cigarette  Vaping Use   Vaping Use: Never used  Substance and Sexual Activity   Alcohol use: Yes    Comment: social only   Drug use: No   Sexual activity: Yes  Other Topics Concern   Not on file  Social History Narrative   Not on file   Social Determinants of Health   Financial Resource Strain: Not on file  Food Insecurity: Not on file  Transportation Needs: Not on file  Physical Activity: Not on file  Stress: Not on file  Social Connections: Not on file     Family History: The patient's family history includes Arthritis in his mother; Cancer in his maternal grandfather; Colon polyps in his brother and father; Coronary artery disease in his maternal grandfather and maternal grandmother; Fibromyalgia in his mother; Food Allergy in his mother; Hypertension in his brother; Lung cancer in his father; Lymphoma in his paternal uncle; Skin cancer in his brother. There is no history of Colon cancer, Esophageal cancer, Stomach cancer, Rectal cancer, Allergic rhinitis, Angioedema, Asthma, Eczema, Immunodeficiency, or Urticaria.  ROS:   Please see the history of present illness.    (+) All other systems reviewed and are negative.  EKGs/Labs/Other Studies Reviewed:    The following studies were reviewed today: No prior CV studies available.   EKG:  EKG is personally reviewed and interpreted. 02/19/2021:  Sinus rhythm 65 nonspecific ST-T wave changes  Recent Labs: 03/12/2020: B Natriuretic Peptide 49.9; BUN 16; Creatinine, Ser 1.40; Hemoglobin 16.7; Magnesium 1.7; Platelets 348; Potassium 2.7; Sodium 143  Recent Lipid Panel No results found for: CHOL, TRIG, HDL, CHOLHDL, VLDL, LDLCALC, LDLDIRECT   Risk Assessment/Calculations:           Physical Exam:    VS:  BP 114/70   Pulse 65   Ht '6\' 1"'$  (1.854 m)   Wt 232 lb 9.6 oz (105.5 kg)   SpO2 96%   BMI 30.69 kg/m  Wt Readings from Last 3 Encounters:  02/19/21 232 lb 9.6 oz (105.5 kg)  04/03/20 245 lb (111.1 kg)  03/12/20 244 lb 11.4 oz (111 kg)     GEN:  Well nourished, well developed in no acute distress HEENT: Normal NECK: No JVD; No carotid bruits LYMPHATICS: No lymphadenopathy CARDIAC: RRR, no murmurs, rubs, gallops RESPIRATORY:  Clear to auscultation without rales, wheezing or rhonchi  ABDOMEN: Soft, non-tender, non-distended MUSCULOSKELETAL:  No edema; No deformity  SKIN: Warm and dry NEUROLOGIC:  Alert and oriented x 3 PSYCHIATRIC:  Normal affect   ASSESSMENT:    1. Palpitations   2. Family history of early CAD   3. Tobacco use    Stone:    In order of problems listed above: Palpitations With frequent palpitations and possible atrial fibrillation, we will go ahead and place a Zio patch monitor for further evaluation.  Recent blood work unremarkable.  Interestingly, his son also has felt some palpitations, age 48.  We will be evaluating him as well in the future.  He had ITP as a 21-year-old.  Family history of early CAD With his father having a myocardial infarction at age 32 and grandfather dying in his 48s, we will go ahead and check a coronary calcium score for any evidence of calcified plaque or atherosclerosis.  If present, in addition to smoking cessation General diet and exercise, we will be a strong advocate for further lipid lowering agent.  Tobacco use Both he and his wife are currently smoking  again.  They both have Wellbutrin and will soon try to quit together.  Excellent.     Follow-up: PRN, I will follow-up with testing  Medication Adjustments/Labs and Tests Ordered: Current medicines are reviewed at length with the patient today.  Concerns regarding medicines are outlined above.  Orders Placed This Encounter  Procedures   CT CARDIAC SCORING (SELF PAY ONLY)   LONG TERM MONITOR (3-14 DAYS)   EKG 12-Lead    No orders of the defined types were placed in this encounter.   Patient Instructions  Medication Instructions:  The current medical regimen is effective;  continue present Stone and medications.  *If you need a refill on your cardiac medications before your next appointment, please call your pharmacy*  Testing/Procedures: Your physician has requested that you have Coronary Calcium score which is completed by CT. Cardiac computed tomography (CT) is a painless test that uses an x-ray machine to take clear, detailed pictures of your heart. This testing is completed here at this office for a cost of $99, due at the time of testing. You may eat/drink as normal this day.    ZIO XT- Long Term Monitor Instructions  Your physician has requested you wear a ZIO patch monitor for 14 days.  This is a single patch monitor. [employer] supplies one patch monitor per enrollment. Additional stickers are not available. Please do not apply patch if you will be having a Nuclear Stress Test,  Echocardiogram, Cardiac CT, MRI, or Chest Xray during the period you would be wearing the  monitor. The patch cannot be worn during these tests. You cannot remove and re-apply the  ZIO XT patch monitor.  Your ZIO patch monitor will be mailed 3 day USPS to your address on file. It may take 3-5 days  to receive your monitor after you have been enrolled.  Once you have received your monitor, please review the enclosed instructions. Your monitor  has already been registered assigning a specific monitor  serial #  to you.  Billing and Patient Assistance Program Information  We have supplied [employer] with any of your insurance information on file for billing purposes. [employer] offers a sliding scale Patient Assistance Program for patients that do not have  insurance, or whose insurance does not completely cover the cost of the ZIO monitor.  You must apply for the Patient Assistance Program to qualify for this discounted rate.  To apply, please call [employer] at [phone], select option 4, select option 2, ask to apply for  Patient Assistance Program. Theodore Demark will ask your household income, and how many people  are in your household. They will quote your out-of-pocket cost based on that information.  [employer] will also be able to set up a 36-month interest-free payment Stone if needed.  Applying the monitor   Shave hair from upper left chest.  Hold abrader disc by orange tab. Rub abrader in 40 strokes over the upper left chest as  indicated in your monitor instructions.  Clean area with 4 enclosed alcohol pads. Let dry.  Apply patch as indicated in monitor instructions. Patch will be placed under collarbone on left  side of chest with arrow pointing upward.  Rub patch adhesive wings for 2 minutes. Remove white label marked "1". Remove the white  label marked "2". Rub patch adhesive wings for 2 additional minutes.  While looking in a mirror, press and release button in center of patch. A small green light will  flash 3-4 times. This will be your only indicator that the monitor has been turned on.  Do not shower for the first 24 hours. You may shower after the first 24 hours.  Press the button if you feel a symptom. You will hear a small click. Record Date, Time and  Symptom in the Patient Logbook.  When you are ready to remove the patch, follow instructions on the last 2 pages of Patient  Logbook. Stick patch monitor onto the last page of Patient Logbook.  Place Patient Logbook in the blue  and white box. Use locking tab on box and tape box closed  securely. The blue and white box has prepaid postage on it. Please place it in the mailbox as  soon as possible. Your physician should have your test results approximately 7 days after the  [facility]  Call [employer] [phone] you have questions regarding  your ZIO XT patch monitor. Call them immediately if you see an orange light blinking on your  monitor.  If your monitor falls off in less than 4 days, contact our Monitor department at [phone]  If your monitor becomes loose or falls off after 4 days call [employer] at [phone]  suggestions on securing your monitor    Follow-Up: [facility] you and your health needs are our priority.  As part of our continuing mission to provide you with exceptional heart care, we have created designated Provider Care Teams.  These Care Teams include your primary Cardiologist (physician) and Advanced Practice Providers (APPs -  Physician Assistants and Nurse Practitioners) who all work together to provide you with the care you need, when you need it.  We recommend signing up for the patient portal called "MyChart".  Sign up information is provided on this After Visit Summary.  MyChart is used to connect with patients for Virtual Visits (Telemedicine).  Patients are able to view lab/test results, encounter notes, upcoming appointments, etc.  Non-urgent messages can be sent  to your provider as well.   To learn more about what you can do with MyChart, go to [email or web address].    Your next appointment:   Follow up as needed with Dr Marlou Porch   Thank you for choosing [facility]!!      Signed, Candee Furbish, MD  02/19/2021 2:02 PM    [place]

## 2021-02-19 ENCOUNTER — Ambulatory Visit (INDEPENDENT_AMBULATORY_CARE_PROVIDER_SITE_OTHER): Payer: BC Managed Care – PPO | Admitting: Cardiology

## 2021-02-19 ENCOUNTER — Encounter: Payer: Self-pay | Admitting: Cardiology

## 2021-02-19 ENCOUNTER — Other Ambulatory Visit: Payer: Self-pay

## 2021-02-19 ENCOUNTER — Ambulatory Visit (INDEPENDENT_AMBULATORY_CARE_PROVIDER_SITE_OTHER): Payer: BC Managed Care – PPO

## 2021-02-19 DIAGNOSIS — Z8249 Family history of ischemic heart disease and other diseases of the circulatory system: Secondary | ICD-10-CM | POA: Diagnosis not present

## 2021-02-19 DIAGNOSIS — R002 Palpitations: Secondary | ICD-10-CM | POA: Diagnosis not present

## 2021-02-19 DIAGNOSIS — Z720 Tobacco use: Secondary | ICD-10-CM | POA: Insufficient documentation

## 2021-02-19 HISTORY — DX: Tobacco use: Z72.0

## 2021-02-19 NOTE — Assessment & Plan Note (Signed)
Both he and his wife are currently smoking again.  They both have Wellbutrin and will soon try to quit together.  Excellent.

## 2021-02-19 NOTE — Assessment & Plan Note (Signed)
With his father having a myocardial infarction at age 60 and grandfather dying in his 51s, we will go ahead and check a coronary calcium score for any evidence of calcified plaque or atherosclerosis.  If present, in addition to smoking cessation General diet and exercise, we will be a strong advocate for further lipid lowering agent.

## 2021-02-19 NOTE — Patient Instructions (Addendum)
Medication Instructions:  The current medical regimen is effective;  continue present plan and medications.  *If you need a refill on your cardiac medications before your next appointment, please call your pharmacy*  Testing/Procedures: Your physician has requested that you have Coronary Calcium score which is completed by CT. Cardiac computed tomography (CT) is a painless test that uses an x-ray machine to take clear, detailed pictures of your heart. This testing is completed here at this office for a cost of $99, due at the time of testing. You may eat/drink as normal this day.    ZIO XT- Long Term Monitor Instructions  Your physician has requested you wear a ZIO patch monitor for 14 days.  This is a single patch monitor. [employer] supplies one patch monitor per enrollment. Additional stickers are not available. Please do not apply patch if you will be having a Nuclear Stress Test,  Echocardiogram, Cardiac CT, MRI, or Chest Xray during the period you would be wearing the  monitor. The patch cannot be worn during these tests. You cannot remove and re-apply the  ZIO XT patch monitor.  Your ZIO patch monitor will be mailed 3 day USPS to your address on file. It may take 3-5 days  to receive your monitor after you have been enrolled.  Once you have received your monitor, please review the enclosed instructions. Your monitor  has already been registered assigning a specific monitor serial # to you.  Billing and Patient Assistance Program Information  We have supplied [employer] with any of your insurance information on file for billing purposes. [employer] offers a sliding scale Patient Assistance Program for patients that do not have  insurance, or whose insurance does not completely cover the cost of the ZIO monitor.  You must apply for the Patient Assistance Program to qualify for this discounted rate.  To apply, please call [employer] at [phone], select option 4, select option 2, ask to apply  for  Patient Assistance Program. Theodore Demark will ask your household income, and how many people  are in your household. They will quote your out-of-pocket cost based on that information.  [employer] will also be able to set up a 79-month interest-free payment plan if needed.  Applying the monitor   Shave hair from upper left chest.  Hold abrader disc by orange tab. Rub abrader in 40 strokes over the upper left chest as  indicated in your monitor instructions.  Clean area with 4 enclosed alcohol pads. Let dry.  Apply patch as indicated in monitor instructions. Patch will be placed under collarbone on left  side of chest with arrow pointing upward.  Rub patch adhesive wings for 2 minutes. Remove white label marked "1". Remove the white  label marked "2". Rub patch adhesive wings for 2 additional minutes.  While looking in a mirror, press and release button in center of patch. A small green light will  flash 3-4 times. This will be your only indicator that the monitor has been turned on.  Do not shower for the first 24 hours. You may shower after the first 24 hours.  Press the button if you feel a symptom. You will hear a small click. Record Date, Time and  Symptom in the Patient Logbook.  When you are ready to remove the patch, follow instructions on the last 2 pages of Patient  Logbook. Stick patch monitor onto the last page of Patient Logbook.  Place Patient Logbook in the blue and white box. Use locking tab on box  and tape box closed  securely. The blue and white box has prepaid postage on it. Please place it in the mailbox as  soon as possible. Your physician should have your test results approximately 7 days after the  monitor has been mailed back to [facility].  Call [place] at [phone] if you have questions regarding  your ZIO XT patch monitor. Call them immediately if you see an orange light blinking on your  monitor.  If your monitor falls off in less than  4 days, contact our Monitor department at [phone].  If your monitor becomes loose or falls off after 4 days call [employer] at [phone] for  suggestions on securing your monitor    Follow-Up: At [facility], you and your health needs are our priority.  As part of our continuing mission to provide you with exceptional heart care, we have created designated Provider Care Teams.  These Care Teams include your primary Cardiologist (physician) and Advanced Practice Providers (APPs -  Physician Assistants and Nurse Practitioners) who all work together to provide you with the care you need, when you need it.  We recommend signing up for the patient portal called "MyChart".  Sign up information is provided on this After Visit Summary.  MyChart is used to connect with patients for Virtual Visits (Telemedicine).  Patients are able to view lab/test results, encounter notes, upcoming appointments, etc.  Non-urgent messages can be sent to your provider as well.   To learn more about what you can do with MyChart, go to [email or web address].    Your next appointment:   Follow up as needed with Dr Marlou Porch   Thank you for choosing [facility]!!

## 2021-02-19 NOTE — Assessment & Plan Note (Addendum)
With frequent palpitations and possible atrial fibrillation, we will go ahead and place a Zio patch monitor for further evaluation.  Recent blood work unremarkable.  Interestingly, his son also has felt some palpitations, age 60.  We will be evaluating him as well in the future.  He had ITP as a 14-year-old.

## 2021-02-19 NOTE — Progress Notes (Unsigned)
Enrolled patient for a 14 day Zio XT mointor to be mailed to patients home

## 2021-02-21 DIAGNOSIS — R002 Palpitations: Secondary | ICD-10-CM | POA: Diagnosis not present

## 2021-02-21 DIAGNOSIS — Z8249 Family history of ischemic heart disease and other diseases of the circulatory system: Secondary | ICD-10-CM

## 2021-02-22 ENCOUNTER — Ambulatory Visit (AMBULATORY_SURGERY_CENTER): Payer: Self-pay | Admitting: *Deleted

## 2021-02-22 ENCOUNTER — Other Ambulatory Visit: Payer: Self-pay

## 2021-02-22 ENCOUNTER — Telehealth: Payer: Self-pay | Admitting: *Deleted

## 2021-02-22 VITALS — Ht 74.0 in | Wt 231.0 lb

## 2021-02-22 DIAGNOSIS — Z86010 Personal history of colonic polyps: Secondary | ICD-10-CM

## 2021-02-22 MED ORDER — ONDANSETRON HCL 4 MG PO TABS: 4.0000 mg | ORAL_TABLET | ORAL | 0 refills | Status: DC

## 2021-02-22 MED ORDER — PLENVU 140 G PO SOLR: 1.0000 | Freq: Once | ORAL | 0 refills | Status: AC

## 2021-02-22 NOTE — Progress Notes (Signed)
Patient is here in-person for PV. Patient denies any allergies to eggs or soy. Patient denies any problems with anesthesia/sedation. Patient denies any oxygen use at home. Patient denies taking any diet/weight loss medications or blood thinners. Patient is aware of our care-partner policy and 0000000 safety protocol.   Plenvu Prep Prescription coupon was given to the patient. Zofran given also.  Patient has Zio heart monitor-sent note to Osvaldo Angst and Dr.Stark-pt is aware.

## 2021-02-22 NOTE — Telephone Encounter (Signed)
Dr.Stark,  This patient is for a recall colon on 03/01/21, hx polyps. He had OV with cardiologist for heart palpitations that he has had for 30 years per pt, patient denies any chest pain and denies shortness of breath. He was placed on Zio 14 day heart monitor-last day will be on 9/15-after colonoscopy.  Okay to proceed with colonoscopy on 03/01/21. Please advise, thank you, Georgean Spainhower pv

## 2021-02-22 NOTE — Telephone Encounter (Signed)
Patient's colonoscopy was rescheduled for 04/04/21. Patient is aware.

## 2021-03-01 ENCOUNTER — Encounter: Payer: BC Managed Care – PPO | Admitting: Gastroenterology

## 2021-03-11 DIAGNOSIS — Z8249 Family history of ischemic heart disease and other diseases of the circulatory system: Secondary | ICD-10-CM | POA: Diagnosis not present

## 2021-03-11 DIAGNOSIS — R002 Palpitations: Secondary | ICD-10-CM | POA: Diagnosis not present

## 2021-03-14 ENCOUNTER — Ambulatory Visit (INDEPENDENT_AMBULATORY_CARE_PROVIDER_SITE_OTHER)
Admission: RE | Admit: 2021-03-14 | Discharge: 2021-03-14 | Disposition: A | Discharge status: Home / Self Care | Payer: Self-pay | Source: Ambulatory Visit | Attending: Cardiology | Admitting: Cardiology

## 2021-03-14 ENCOUNTER — Other Ambulatory Visit: Payer: Self-pay

## 2021-03-14 DIAGNOSIS — Z8249 Family history of ischemic heart disease and other diseases of the circulatory system: Secondary | ICD-10-CM

## 2021-03-15 ENCOUNTER — Other Ambulatory Visit: Payer: Self-pay | Admitting: *Deleted

## 2021-03-15 DIAGNOSIS — Z8249 Family history of ischemic heart disease and other diseases of the circulatory system: Secondary | ICD-10-CM

## 2021-03-15 DIAGNOSIS — E7849 Other hyperlipidemia: Secondary | ICD-10-CM

## 2021-03-15 MED ORDER — ROSUVASTATIN CALCIUM 20 MG PO TABS: 20.0000 mg | ORAL_TABLET | Freq: Every day | ORAL | 3 refills | Status: DC

## 2021-03-19 ENCOUNTER — Encounter: Payer: Self-pay | Admitting: Gastroenterology

## 2021-04-04 ENCOUNTER — Other Ambulatory Visit: Payer: Self-pay

## 2021-04-04 ENCOUNTER — Ambulatory Visit (AMBULATORY_SURGERY_CENTER): Payer: BC Managed Care – PPO | Admitting: Gastroenterology

## 2021-04-04 ENCOUNTER — Encounter: Payer: Self-pay | Admitting: Gastroenterology

## 2021-04-04 VITALS — BP 144/84 | HR 59 | Temp 97.8°F | Resp 14 | Ht 74.0 in | Wt 231.0 lb

## 2021-04-04 DIAGNOSIS — Z1211 Encounter for screening for malignant neoplasm of colon: Secondary | ICD-10-CM | POA: Diagnosis not present

## 2021-04-04 DIAGNOSIS — Z86010 Personal history of colonic polyps: Secondary | ICD-10-CM

## 2021-04-04 DIAGNOSIS — D123 Benign neoplasm of transverse colon: Secondary | ICD-10-CM | POA: Diagnosis not present

## 2021-04-04 DIAGNOSIS — D122 Benign neoplasm of ascending colon: Secondary | ICD-10-CM | POA: Diagnosis not present

## 2021-04-04 HISTORY — PX: COLONOSCOPY WITH PROPOFOL: SHX5780

## 2021-04-04 MED ORDER — SODIUM CHLORIDE 0.9 % IV SOLN: 500.0000 mL | Freq: Once | INTRAVENOUS | Status: DC

## 2021-04-04 NOTE — Progress Notes (Signed)
Called to room to assist during endoscopic procedure.  Patient ID and intended procedure confirmed with present staff. Received instructions for my participation in the procedure from the performing physician.  

## 2021-04-04 NOTE — Progress Notes (Signed)
No problems noted in the recovery room. maw 

## 2021-04-04 NOTE — Op Note (Signed)
[place] Patient Name: Keith Stone Procedure Date: 04/04/2021 3:59 PM MRN: [id number] Endoscopist: Ladene Artist , MD Age: 60 Referring MD:  Date of Birth: 1960-11-05 Gender: Male Account #: [id number] Procedure:                Colonoscopy Indications:              Surveillance: Personal history of adenomatous                            polyps on last colonoscopy 3 years ago Medicines:                Monitored Anesthesia Care Procedure:                Pre-Anesthesia Assessment:                           - Prior to the procedure, a History and Physical                            was performed, and patient medications and                            allergies were reviewed. The patient's tolerance of                            previous anesthesia was also reviewed. The risks                            and benefits of the procedure and the sedation                            options and risks were discussed with the patient.                            All questions were answered, and informed consent                            was obtained. Prior Anticoagulants: The patient has                            taken no previous anticoagulant or antiplatelet                            agents. ASA Grade Assessment: II - A patient with                            mild systemic disease. After reviewing the risks                            and benefits, the patient was deemed in                            satisfactory condition to undergo the procedure.  After obtaining informed consent, the colonoscope                            was passed under direct vision. Throughout the                            procedure, the patient's blood pressure, pulse, and                            oxygen saturations were monitored continuously. The                            CF [id number] #[id number] was introduced through the anus                            and advanced to the  the cecum, identified by                            appendiceal orifice and ileocecal valve. The                            ileocecal valve, appendiceal orifice, and rectum                            were photographed. The quality of the bowel                            preparation was good. The colonoscopy was performed                            without difficulty. The patient tolerated the                            procedure well. Scope In: 4:16:59 PM Scope Out: 4:33:45 PM Scope Withdrawal Time: 0 hours 13 minutes 46 seconds  Total Procedure Duration: 0 hours 16 minutes 46 seconds  Findings:                 The perianal and digital rectal examinations were                            normal.                           A 4 mm polyp was found in the ascending colon. The                            polyp was sessile. The polyp was removed with a                            cold biopsy forceps. Resection and retrieval were                            complete.  Four sessile polyps were found in the transverse                            colon. The polyps were 5 to 8 mm in size. These                            polyps were removed with a cold snare. Resection                            and retrieval were complete.                           Multiple small-mouthed diverticula were found in                            the left colon. There was narrowing of the colon in                            association with the diverticular opening. There                            was evidence of diverticular spasm. There was no                            evidence of diverticular bleeding.                           Internal hemorrhoids were found during                            retroflexion. The hemorrhoids were small and Grade                            I (internal hemorrhoids that do not prolapse).                           The exam was otherwise without abnormality on                             direct and retroflexion views. Complications:            No immediate complications. Estimated blood loss:                            None. Estimated Blood Loss:     Estimated blood loss: none. Impression:               - One 4 mm polyp in the ascending colon, removed                            with a cold biopsy forceps. Resected and retrieved.                           - Four 5 to 8 mm polyps in the transverse colon,  removed with a cold snare. Resected and retrieved.                           - Mild diverticulosis in the left colon.                           - Internal hemorrhoids.                           - The examination was otherwise normal on direct                            and retroflexion views. Recommendation:           - Repeat colonoscopy after studies are complete for                            surveillance based on pathology results.                           - Patient has a contact number available for                            emergencies. The signs and symptoms of potential                            delayed complications were discussed with the                            patient. Return to normal activities tomorrow.                            Written discharge instructions were provided to the                            patient.                           - High fiber diet.                           - Continue present medications.                           - Await pathology results. Ladene Artist, MD 04/04/2021 4:37:35 PM This report has been signed electronically.

## 2021-04-04 NOTE — Progress Notes (Signed)
Sedate, gd SR, tolerated procedure well, VSS, report to RN 

## 2021-04-04 NOTE — Progress Notes (Signed)
History & Physical  Primary Care Physician:  Prince Solian, MD Primary Gastroenterologist: Lucio Edward, MD  CHIEF COMPLAINT:  Personal history of colon polyps   HPI: Keith Stone is a 60 y.o. male with a history of adenomatous colon polyps presenting for surveillance colonoscopy.  No active gastrointestinal complaints.   Past Medical History:  Diagnosis Date   Adenomatous colon polyp 08/26/2011   HYPERPLASTIC POLYP (MULTIPLE FRAGMENTS AND TUBULAR ADENOMA (ONE FRAGMENT)   Allergic rhinitis    Allergy    Asthma    as child, none now   Complication of anesthesia 20 years ago   woke up with mouth oral surgery   DJD (degenerative joint disease)    GERD (gastroesophageal reflux disease)    occ uses alka seltzer   Hypertension    Insomnia    Obstructive sleep apnea    no CPAP   Palpitations    Prostate cancer ([place])    Sleep apnea    no cpap    Tobacco use 02/19/2021    Past Surgical History:  Procedure Laterality Date   BILATERAL KNEE ARTHROSCOPY  0814,48   COLONOSCOPY  06/26/2017   Dr.Yemariam Magar   COLONOSCOPY  2013   LYMPHADENECTOMY Bilateral 02/22/2014   Procedure: PELVIC LYMPH NODE DISSECTION;  Surgeon: Bernestine Amass, MD;  Location: [facility];  Service: Urology;  Laterality: Bilateral;   MOUTH SURGERY  20 years ago   POLYPECTOMY     PROSTATE BIOPSY  11/22/2013   ROBOT ASSISTED LAPAROSCOPIC RADICAL PROSTATECTOMY N/A 02/22/2014   Procedure: ROBOTIC ASSISTED LAPAROSCOPIC RADICAL PROSTATECTOMY;  Surgeon: Bernestine Amass, MD;  Location: [facility];  Service: Urology;  Laterality: N/A;   TONSILLECTOMY  as child   TYMPANOSTOMY TUBE PLACEMENT     several times    VASECTOMY  2000    Prior to Admission medications   Medication Sig Start Date End Date Taking? Authorizing Provider  aspirin 81 MG chewable tablet Chew 81 mg by mouth daily.   Yes [provider]  carvedilol (COREG) 12.5 MG tablet Take 12.5 mg by mouth 2 (two) times daily with a meal.   Yes [provider]  cetirizine (ZYRTEC) 10 MG tablet  04/06/18  Yes [provider]  felodipine (PLENDIL) 10 MG 24 hr tablet Take 10 mg by mouth daily.   Yes [provider]  HYDROcodone-acetaminophen (NORCO/VICODIN) 5-325 MG per tablet Take 1-2 tablets by mouth every 6 (six) hours as needed for moderate pain. 02/22/14  Yes Dancy, Estill Bamberg, PA-C  losartan-hydrochlorothiazide (HYZAAR) 100-25 MG per tablet Take 0.5 tablets by mouth 2 (two) times daily.    Yes [provider]  Omega-3 Fatty Acids (FISH OIL) 1200 MG CAPS Take by mouth.   Yes [provider]  ondansetron (ZOFRAN) 4 MG tablet Take 1 tablet (4 mg total) by mouth as directed. Take one Zofran pill 30-60 minutes before each colonoscopy prep dose 02/22/21  Yes Ladene Artist, MD  rosuvastatin (CRESTOR) 20 MG tablet Take 1 tablet (20 mg total) by mouth daily. 03/15/21 06/13/21 Yes Jerline Pain, MD  zolpidem (AMBIEN) 10 MG tablet Take 10 mg by mouth at bedtime as needed.   Yes [provider]  albuterol (VENTOLIN HFA) 108 (90 Base) MCG/ACT inhaler Inhale 2 puffs into the lungs every 6 (six) hours as needed for wheezing or shortness of breath. Patient not taking: Reported on 04/04/2021    [provider]  EPINEPHrine 0.3 mg/0.3 mL IJ SOAJ injection Inject 0.3 mg into the  muscle as needed for anaphylaxis. Patient not taking: Reported on 04/04/2021 03/12/20   [name], MD    Current Outpatient Medications  Medication Sig Dispense Refill   aspirin 81 MG chewable tablet Chew 81 mg by mouth daily.     carvedilol (COREG) 12.5 MG tablet Take 12.5 mg by mouth 2 (two) times daily with a meal.     cetirizine (ZYRTEC) 10 MG tablet      felodipine (PLENDIL) 10 MG 24 hr tablet Take 10 mg by mouth daily.     HYDROcodone-acetaminophen (NORCO/VICODIN) 5-325 MG per tablet Take 1-2 tablets by mouth every 6 (six) hours as needed for moderate pain. 30 tablet 0   losartan-hydrochlorothiazide (HYZAAR) 100-25 MG  per tablet Take 0.5 tablets by mouth 2 (two) times daily.      Omega-3 Fatty Acids (FISH OIL) 1200 MG CAPS Take by mouth.     ondansetron (ZOFRAN) 4 MG tablet Take 1 tablet (4 mg total) by mouth as directed. Take one Zofran pill 30-60 minutes before each colonoscopy prep dose 2 tablet 0   rosuvastatin (CRESTOR) 20 MG tablet Take 1 tablet (20 mg total) by mouth daily. 90 tablet 3   zolpidem (AMBIEN) 10 MG tablet Take 10 mg by mouth at bedtime as needed.     albuterol (VENTOLIN HFA) 108 (90 Base) MCG/ACT inhaler Inhale 2 puffs into the lungs every 6 (six) hours as needed for wheezing or shortness of breath. (Patient not taking: Reported on 04/04/2021)     EPINEPHrine 0.3 mg/0.3 mL IJ SOAJ injection Inject 0.3 mg into the muscle as needed for anaphylaxis. (Patient not taking: Reported on 04/04/2021) 1 each 0   Current Facility-Administered Medications  Medication Dose Route Frequency Provider Last Rate Last Admin   0.9 %  sodium chloride infusion  500 mL Intravenous Once Lucio Edward T, MD       0.9 %  sodium chloride infusion  500 mL Intravenous Once Ladene Artist, MD        Allergies as of 04/04/2021 - Review Complete 04/04/2021  Allergen Reaction Noted   Beef allergy  02/22/2021   Pork-derived products  02/22/2021    Family History  Problem Relation Age of Onset   Arthritis Mother    Fibromyalgia Mother    Food Allergy Mother    Coronary artery disease Maternal Grandmother    Coronary artery disease Maternal Grandfather    Cancer Maternal Grandfather    Hypertension Brother    Colon polyps Brother    Skin cancer Brother    Colon polyps Father    Lung cancer Father    Lymphoma Paternal Uncle    Colon cancer Neg Hx    Esophageal cancer Neg Hx    Stomach cancer Neg Hx    Rectal cancer Neg Hx    Allergic rhinitis Neg Hx    Angioedema Neg Hx    Asthma Neg Hx    Eczema Neg Hx    Immunodeficiency Neg Hx    Urticaria Neg Hx     Social History   Socioeconomic History    Marital status: Married    Spouse name: Not on file   Number of children: 3   Years of education: Not on file   Highest education level: Not on file  Occupational History   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  Tobacco Use   Smoking status: Every Day    Packs/day: 1.00    Years: 30.00    Pack years: 30.00  Types: Cigarettes   Smokeless tobacco: Never   Tobacco comments:    quit cigarettes 2 years ago 1 and 1/2 ppd x 30 years, now e cigarette  Vaping Use   Vaping Use: Former  Substance and Sexual Activity   Alcohol use: Yes    Comment: social only/maybe monthly per pt   Drug use: No   Sexual activity: Yes  Other Topics Concern   Not on file  Social History Narrative   Not on file   Social Determinants of Health   Financial Resource Strain: Not on file  Food Insecurity: Not on file  Transportation Needs: Not on file  Physical Activity: Not on file  Stress: Not on file  Social Connections: Not on file  Intimate Partner Violence: Not on file    Review of Systems:  All systems reviewed an negative except where noted in HPI.  Gen: Denies any fever, chills, sweats, anorexia, fatigue, weakness, malaise, weight loss, and sleep disorder CV: Denies chest pain, angina, palpitations, syncope, orthopnea, PND, peripheral edema, and claudication. Resp: Denies dyspnea at rest, dyspnea with exercise, cough, sputum, wheezing, coughing up blood, and pleurisy. GI: Denies vomiting blood, jaundice, and fecal incontinence.   Denies dysphagia or odynophagia. GU : Denies urinary burning, blood in urine, urinary frequency, urinary hesitancy, nocturnal urination, and urinary incontinence. MS: Denies joint pain, limitation of movement, and swelling, stiffness, low back pain, extremity pain. Denies muscle weakness, cramps, atrophy.  Derm: Denies rash, itching, dry skin, hives, moles, warts, or unhealing ulcers.  Psych: Denies depression, anxiety, memory loss, suicidal ideation,  hallucinations, paranoia, and confusion. Heme: Denies bruising, bleeding, and enlarged lymph nodes. Neuro:  Denies any headaches, dizziness, paresthesias. Endo:  Denies any problems with DM, thyroid, adrenal function.   Physical Exam: General:  Alert, well-developed, in NAD Head:  Normocephalic and atraumatic. Eyes:  Sclera clear, no icterus.   Conjunctiva pink. Ears:  Normal auditory acuity. Mouth:  No deformity or lesions.  Neck:  Supple; no masses . Lungs:  Clear throughout to auscultation.   No wheezes, crackles, or rhonchi. No acute distress. Heart:  Regular rate and rhythm; no murmurs. Abdomen:  Soft, nondistended, nontender. No masses, hepatomegaly. No obvious masses.  Normal bowel .    Rectal:  Deferred   Msk:  Symmetrical without gross deformities.. Pulses:  Normal pulses noted. Extremities:  Without edema. Neurologic:  Alert and  oriented x4;  grossly normal neurologically. Skin:  Intact without significant lesions or rashes. Cervical Nodes:  No significant cervical adenopathy. Psych:  Alert and cooperative. Normal mood and affect.   Impression / Plan:   Personal history of adenomatous colon polyps for surveillance colonoscopy.   This patient is appropriate for endoscopic procedures in the ambulatory setting.    Pricilla Riffle. Fuller Plan  04/04/2021, 4:14 PM See Shea Evans, [facility], to contact our on call provider

## 2021-04-04 NOTE — Patient Instructions (Addendum)
Handouts were given to your care partner on polyps, hemorrhoids,  diverticulosis, and a high fiber diet with liberal fluid intake. You may resume your current medications today. Await biopsy results.  May take 1-3 weeks to receive pathology results. Please call if any questions or concerns.     YOU HAD AN ENDOSCOPIC PROCEDURE TODAY AT [facility]:   Refer to the procedure report that was given to you for any specific questions about what was found during the examination.  If the procedure report does not answer your questions, please call your gastroenterologist to clarify.  If you requested that your care partner not be given the details of your procedure findings, then the procedure report has been included in a sealed envelope for you to review at your convenience later.  YOU SHOULD EXPECT: Some feelings of bloating in the abdomen. Passage of more gas than usual.  Walking can help get rid of the air that was put into your GI tract during the procedure and reduce the bloating. If you had a lower endoscopy (such as a colonoscopy or flexible sigmoidoscopy) you may notice spotting of blood in your stool or on the toilet paper. If you underwent a bowel prep for your procedure, you may not have a normal bowel movement for a few days.  Please Note:  You might notice some irritation and congestion in your nose or some drainage.  This is from the oxygen used during your procedure.  There is no need for concern and it should clear up in a day or so.  SYMPTOMS TO REPORT IMMEDIATELY:  Following lower endoscopy (colonoscopy or flexible sigmoidoscopy):  Excessive amounts of blood in the stool  Significant tenderness or worsening of abdominal pains  Swelling of the abdomen that is new, acute  Fever of 100F or higher   For urgent or emergent issues, a gastroenterologist can be reached at any hour by calling [phone]. Do not use MyChart messaging for urgent concerns.    DIET:  We  do recommend a small meal at first, but then you may proceed to your regular diet.  Drink plenty of fluids but you should avoid alcoholic beverages for 24 hours.  ACTIVITY:  You should plan to take it easy for the rest of today and you should NOT DRIVE or use heavy machinery until tomorrow (because of the sedation medicines used during the test).    FOLLOW UP: Our staff will call the number listed on your records 48-72 hours following your procedure to check on you and address any questions or concerns that you may have regarding the information given to you following your procedure. If we do not reach you, we will leave a message.  We will attempt to reach you two times.  During this call, we will ask if you have developed any symptoms of COVID 19. If you develop any symptoms (ie: fever, flu-like symptoms, shortness of breath, cough etc.) before then, please call [phone].  If you test positive for Covid 19 in the 2 weeks post procedure, please call and report this information to [place].    If any biopsies were taken you will be contacted by phone or by letter within the next 1-3 weeks.  Please call us at [phone] if you have not heard about the biopsies in 3 weeks.    SIGNATURES/CONFIDENTIALITY: You and/or your care partner have signed paperwork which will be entered into your electronic medical record.  These signatures attest to the fact  that that the information above on your After Visit Summary has been reviewed and is understood.  Full responsibility of the confidentiality of this discharge information lies with you and/or your care-partner.

## 2021-04-08 ENCOUNTER — Telehealth: Payer: Self-pay

## 2021-04-08 NOTE — Telephone Encounter (Signed)
  Follow up Call-  Call back number 04/04/2021  Post procedure Call Back phone  # [phone]  Permission to leave phone message Yes  Some recent data might be hidden     Patient questions:  Do you have a fever, pain , or abdominal swelling? No. Pain Score  0 *  Have you tolerated food without any problems? Yes.    Have you been able to return to your normal activities? Yes.    Do you have any questions about your discharge instructions: Diet   No. Medications  No. Follow up visit  No.  Do you have questions or concerns about your Care? No.  Actions: * If pain score is 4 or above: No action needed, pain <4. Have you developed a fever since your procedure? no  2.   Have you had an respiratory symptoms (SOB or cough) since your procedure? no  3.   Have you tested positive for COVID 19 since your procedure no  4.   Have you had any family members/close contacts diagnosed with the COVID 19 since your procedure?  no   If yes to any of these questions please route to Joylene John, RN and Joella Prince, RN

## 2021-04-16 ENCOUNTER — Encounter: Payer: Self-pay | Admitting: Gastroenterology

## 2021-04-16 DIAGNOSIS — I10 Essential (primary) hypertension: Secondary | ICD-10-CM | POA: Diagnosis not present

## 2021-06-11 ENCOUNTER — Other Ambulatory Visit: Payer: BC Managed Care – PPO

## 2021-06-11 ENCOUNTER — Other Ambulatory Visit: Payer: Self-pay

## 2021-06-11 DIAGNOSIS — Z8249 Family history of ischemic heart disease and other diseases of the circulatory system: Secondary | ICD-10-CM

## 2021-06-11 DIAGNOSIS — E7849 Other hyperlipidemia: Secondary | ICD-10-CM | POA: Diagnosis not present

## 2021-06-12 ENCOUNTER — Encounter: Payer: Self-pay | Admitting: *Deleted

## 2021-06-12 LAB — LIPID PANEL
Chol/HDL Ratio: 1.7 ratio (ref 0.0–5.0)
Cholesterol, Total: 112 mg/dL (ref 100–199)
HDL: 67 mg/dL (ref >39)
LDL Chol Calc (NIH): 29 mg/dL (ref 0–99)
Triglycerides: 77 mg/dL (ref 0–149)
VLDL Cholesterol Cal: 16 mg/dL (ref 5–40)

## 2021-06-12 LAB — ALT: ALT: 19 IU/L (ref 0–44)

## 2021-06-14 ENCOUNTER — Other Ambulatory Visit: Payer: BC Managed Care – PPO

## 2021-09-10 DIAGNOSIS — D225 Melanocytic nevi of trunk: Secondary | ICD-10-CM | POA: Diagnosis not present

## 2021-09-10 DIAGNOSIS — D2362 Other benign neoplasm of skin of left upper limb, including shoulder: Secondary | ICD-10-CM | POA: Diagnosis not present

## 2021-09-10 DIAGNOSIS — D485 Neoplasm of uncertain behavior of skin: Secondary | ICD-10-CM | POA: Diagnosis not present

## 2021-09-10 DIAGNOSIS — L578 Other skin changes due to chronic exposure to nonionizing radiation: Secondary | ICD-10-CM | POA: Diagnosis not present

## 2021-09-10 DIAGNOSIS — L570 Actinic keratosis: Secondary | ICD-10-CM | POA: Diagnosis not present

## 2021-09-13 DIAGNOSIS — R109 Unspecified abdominal pain: Secondary | ICD-10-CM | POA: Diagnosis not present

## 2021-09-24 DIAGNOSIS — R310 Gross hematuria: Secondary | ICD-10-CM | POA: Diagnosis not present

## 2021-09-24 DIAGNOSIS — R8271 Bacteriuria: Secondary | ICD-10-CM | POA: Diagnosis not present

## 2021-10-02 DIAGNOSIS — K5730 Diverticulosis of large intestine without perforation or abscess without bleeding: Secondary | ICD-10-CM | POA: Diagnosis not present

## 2021-10-02 DIAGNOSIS — I700 Atherosclerosis of aorta: Secondary | ICD-10-CM | POA: Diagnosis not present

## 2021-10-02 DIAGNOSIS — N3289 Other specified disorders of bladder: Secondary | ICD-10-CM | POA: Diagnosis not present

## 2021-10-02 DIAGNOSIS — R310 Gross hematuria: Secondary | ICD-10-CM | POA: Diagnosis not present

## 2021-10-02 DIAGNOSIS — N281 Cyst of kidney, acquired: Secondary | ICD-10-CM | POA: Diagnosis not present

## 2021-10-07 DIAGNOSIS — Z8546 Personal history of malignant neoplasm of prostate: Secondary | ICD-10-CM | POA: Diagnosis not present

## 2021-10-07 DIAGNOSIS — R8271 Bacteriuria: Secondary | ICD-10-CM | POA: Diagnosis not present

## 2021-10-07 DIAGNOSIS — R310 Gross hematuria: Secondary | ICD-10-CM | POA: Diagnosis not present

## 2021-10-28 DIAGNOSIS — R310 Gross hematuria: Secondary | ICD-10-CM | POA: Diagnosis not present

## 2021-10-29 ENCOUNTER — Other Ambulatory Visit: Payer: Self-pay | Admitting: Urology

## 2021-11-05 DIAGNOSIS — E291 Testicular hypofunction: Secondary | ICD-10-CM | POA: Diagnosis not present

## 2021-11-05 DIAGNOSIS — I10 Essential (primary) hypertension: Secondary | ICD-10-CM | POA: Diagnosis not present

## 2021-11-05 DIAGNOSIS — Z125 Encounter for screening for malignant neoplasm of prostate: Secondary | ICD-10-CM | POA: Diagnosis not present

## 2021-11-12 DIAGNOSIS — I10 Essential (primary) hypertension: Secondary | ICD-10-CM | POA: Diagnosis not present

## 2021-11-12 DIAGNOSIS — Z0000 Encounter for general adult medical examination without abnormal findings: Secondary | ICD-10-CM | POA: Diagnosis not present

## 2021-11-12 DIAGNOSIS — N390 Urinary tract infection, site not specified: Secondary | ICD-10-CM | POA: Diagnosis not present

## 2021-11-13 ENCOUNTER — Encounter (HOSPITAL_BASED_OUTPATIENT_CLINIC_OR_DEPARTMENT_OTHER): Payer: Self-pay | Admitting: Urology

## 2021-11-13 NOTE — Progress Notes (Signed)
Spoke w/ via phone for pre-op interview--- pt Lab needs dos----   [employer] results------ current ekg in epic/ chart COVID test -----patient states asymptomatic no test needed Arrive at ------- 0845 on 11-14-2021 NPO after MN NO Solid Food.  Clear liquids from MN until--- 0745 Med rec completed Medications to take morning of surgery ----- crestor, coreg, plendil Diabetic medication ----- n/a Patient instructed no nail polish to be worn day of surgery Patient instructed to bring photo id and insurance card day of surgery Patient aware to have Driver (ride ) / caregiver for 24 hours after surgery --son, noah Patient Special Instructions ----- n/a Pre-Op special Istructions ----- n/a Patient verbalized understanding of instructions that were given at this phone interview. Patient denies shortness of breath, chest pain, fever, cough at this phone interview.

## 2021-11-14 ENCOUNTER — Encounter (HOSPITAL_BASED_OUTPATIENT_CLINIC_OR_DEPARTMENT_OTHER): Payer: Self-pay | Admitting: Urology

## 2021-11-14 ENCOUNTER — Ambulatory Visit (HOSPITAL_BASED_OUTPATIENT_CLINIC_OR_DEPARTMENT_OTHER)
Admission: RE | Admit: 2021-11-14 | Discharge: 2021-11-14 | Disposition: A | Discharge status: Home / Self Care | Payer: BC Managed Care – PPO | Source: Ambulatory Visit | Attending: Urology | Admitting: Urology

## 2021-11-14 ENCOUNTER — Ambulatory Visit (HOSPITAL_BASED_OUTPATIENT_CLINIC_OR_DEPARTMENT_OTHER): Payer: BC Managed Care – PPO | Admitting: Anesthesiology

## 2021-11-14 ENCOUNTER — Encounter (HOSPITAL_BASED_OUTPATIENT_CLINIC_OR_DEPARTMENT_OTHER)
Admission: RE | Disposition: A | Discharge status: Home / Self Care | Payer: Self-pay | Source: Ambulatory Visit | Attending: Urology

## 2021-11-14 DIAGNOSIS — Z8546 Personal history of malignant neoplasm of prostate: Secondary | ICD-10-CM | POA: Insufficient documentation

## 2021-11-14 DIAGNOSIS — I10 Essential (primary) hypertension: Secondary | ICD-10-CM | POA: Diagnosis not present

## 2021-11-14 DIAGNOSIS — F17210 Nicotine dependence, cigarettes, uncomplicated: Secondary | ICD-10-CM | POA: Diagnosis not present

## 2021-11-14 DIAGNOSIS — Z01818 Encounter for other preprocedural examination: Secondary | ICD-10-CM

## 2021-11-14 DIAGNOSIS — Z923 Personal history of irradiation: Secondary | ICD-10-CM | POA: Insufficient documentation

## 2021-11-14 DIAGNOSIS — R310 Gross hematuria: Secondary | ICD-10-CM | POA: Insufficient documentation

## 2021-11-14 DIAGNOSIS — N3289 Other specified disorders of bladder: Secondary | ICD-10-CM | POA: Diagnosis not present

## 2021-11-14 HISTORY — DX: Unspecified osteoarthritis, unspecified site: M19.90

## 2021-11-14 HISTORY — DX: Personal history of irradiation: Z92.3

## 2021-11-14 HISTORY — DX: Gross hematuria: R31.0

## 2021-11-14 HISTORY — DX: Personal history of adenomatous and serrated colon polyps: Z86.0101

## 2021-11-14 HISTORY — DX: Obstructive sleep apnea (adult) (pediatric): G47.33

## 2021-11-14 HISTORY — DX: Unspecified symptoms and signs involving the genitourinary system: R39.9

## 2021-11-14 HISTORY — DX: Presence of spectacles and contact lenses: Z97.3

## 2021-11-14 HISTORY — DX: Male erectile dysfunction, unspecified: N52.9

## 2021-11-14 HISTORY — DX: Personal history of other diseases of the respiratory system: Z87.09

## 2021-11-14 HISTORY — DX: Personal history of colonic polyps: Z86.010

## 2021-11-14 HISTORY — DX: Dysuria: R30.0

## 2021-11-14 HISTORY — PX: CYSTOSCOPY WITH BIOPSY: SHX5122

## 2021-11-14 LAB — POCT I-STAT, CHEM 8
BUN: 10 mg/dL (ref 6–20)
Calcium, Ion: 1.2 mmol/L (ref 1.15–1.40)
Chloride: 100 mmol/L (ref 98–111)
Creatinine, Ser: 0.7 mg/dL (ref 0.61–1.24)
Glucose, Bld: 113 mg/dL — ABNORMAL HIGH (ref 70–99)
HCT: 48 % (ref 39.0–52.0)
Hemoglobin: 16.3 g/dL (ref 13.0–17.0)
Potassium: 3 mmol/L — ABNORMAL LOW (ref 3.5–5.1)
Sodium: 141 mmol/L (ref 135–145)
TCO2: 28 mmol/L (ref 22–32)

## 2021-11-14 SURGERY — CYSTOSCOPY, WITH BIOPSY
Anesthesia: General | Site: Bladder

## 2021-11-14 MED ORDER — IOHEXOL 300 MG/ML  SOLN
INTRAMUSCULAR | Status: DC | PRN
  Administered 2021-11-14: 20 mL

## 2021-11-14 MED ORDER — FENTANYL CITRATE (PF) 100 MCG/2ML IJ SOLN
INTRAMUSCULAR | Status: DC | PRN
  Administered 2021-11-14: 100 ug via INTRAVENOUS

## 2021-11-14 MED ORDER — SUGAMMADEX SODIUM 500 MG/5ML IV SOLN
INTRAVENOUS | Status: DC | PRN
  Administered 2021-11-14: 250 mg via INTRAVENOUS

## 2021-11-14 MED ORDER — DEXAMETHASONE SODIUM PHOSPHATE 10 MG/ML IJ SOLN
INTRAMUSCULAR | Status: AC
  Filled 2021-11-14: qty 1

## 2021-11-14 MED ORDER — FENTANYL CITRATE (PF) 100 MCG/2ML IJ SOLN: 25.0000 ug | INTRAMUSCULAR | Status: DC | PRN

## 2021-11-14 MED ORDER — ACETAMINOPHEN 500 MG PO TABS
1000.0000 mg | ORAL_TABLET | Freq: Once | ORAL | Status: AC
  Administered 2021-11-14: 1000 mg via ORAL

## 2021-11-14 MED ORDER — SODIUM CHLORIDE 0.9 % IR SOLN
Status: DC | PRN
  Administered 2021-11-14 (×2): 3000 mL via INTRAVESICAL

## 2021-11-14 MED ORDER — OXYCODONE HCL 5 MG PO TABS: 5.0000 mg | ORAL_TABLET | Freq: Once | ORAL | Status: DC | PRN

## 2021-11-14 MED ORDER — PROPOFOL 10 MG/ML IV BOLUS
INTRAVENOUS | Status: AC
Start: 2021-11-14 — End: ?
  Filled 2021-11-14: qty 20

## 2021-11-14 MED ORDER — AMISULPRIDE (ANTIEMETIC) 5 MG/2ML IV SOLN: 10.0000 mg | Freq: Once | INTRAVENOUS | Status: DC | PRN

## 2021-11-14 MED ORDER — EPHEDRINE 5 MG/ML INJ
INTRAVENOUS | Status: AC
  Filled 2021-11-14: qty 5

## 2021-11-14 MED ORDER — MIDAZOLAM HCL 2 MG/2ML IJ SOLN
INTRAMUSCULAR | Status: DC | PRN
  Administered 2021-11-14: 2 mg via INTRAVENOUS

## 2021-11-14 MED ORDER — MIDAZOLAM HCL 2 MG/2ML IJ SOLN
INTRAMUSCULAR | Status: AC
  Filled 2021-11-14: qty 2

## 2021-11-14 MED ORDER — CEFAZOLIN SODIUM-DEXTROSE 2-4 GM/100ML-% IV SOLN
INTRAVENOUS | Status: AC
  Filled 2021-11-14: qty 100

## 2021-11-14 MED ORDER — ONDANSETRON HCL 4 MG/2ML IJ SOLN
INTRAMUSCULAR | Status: AC
  Filled 2021-11-14: qty 2

## 2021-11-14 MED ORDER — LIDOCAINE 2% (20 MG/ML) 5 ML SYRINGE
INTRAMUSCULAR | Status: DC | PRN
  Administered 2021-11-14: 60 mg via INTRAVENOUS

## 2021-11-14 MED ORDER — OXYCODONE HCL 5 MG/5ML PO SOLN: 5.0000 mg | Freq: Once | ORAL | Status: DC | PRN

## 2021-11-14 MED ORDER — TRAMADOL HCL 50 MG PO TABS: 50.0000 mg | ORAL_TABLET | Freq: Four times a day (QID) | ORAL | 0 refills | Status: DC | PRN

## 2021-11-14 MED ORDER — ONDANSETRON HCL 4 MG/2ML IJ SOLN
INTRAMUSCULAR | Status: DC | PRN
  Administered 2021-11-14: 4 mg via INTRAVENOUS

## 2021-11-14 MED ORDER — ONDANSETRON HCL 4 MG/2ML IJ SOLN: 4.0000 mg | Freq: Once | INTRAMUSCULAR | Status: DC | PRN

## 2021-11-14 MED ORDER — LACTATED RINGERS IV SOLN: INTRAVENOUS | Status: DC

## 2021-11-14 MED ORDER — DEXAMETHASONE SODIUM PHOSPHATE 10 MG/ML IJ SOLN
INTRAMUSCULAR | Status: DC | PRN
  Administered 2021-11-14: 5 mg via INTRAVENOUS

## 2021-11-14 MED ORDER — ROCURONIUM BROMIDE 10 MG/ML (PF) SYRINGE
PREFILLED_SYRINGE | INTRAVENOUS | Status: AC
  Filled 2021-11-14: qty 10

## 2021-11-14 MED ORDER — FENTANYL CITRATE (PF) 100 MCG/2ML IJ SOLN
INTRAMUSCULAR | Status: AC
Start: 2021-11-14 — End: ?
  Filled 2021-11-14: qty 2

## 2021-11-14 MED ORDER — SUGAMMADEX SODIUM 500 MG/5ML IV SOLN
INTRAVENOUS | Status: AC
  Filled 2021-11-14: qty 5

## 2021-11-14 MED ORDER — ROCURONIUM BROMIDE 10 MG/ML (PF) SYRINGE
PREFILLED_SYRINGE | INTRAVENOUS | Status: DC | PRN
  Administered 2021-11-14: 60 mg via INTRAVENOUS

## 2021-11-14 MED ORDER — CEFAZOLIN SODIUM-DEXTROSE 2-4 GM/100ML-% IV SOLN
2.0000 g | Freq: Once | INTRAVENOUS | Status: AC
  Administered 2021-11-14: 2 g via INTRAVENOUS

## 2021-11-14 MED ORDER — EPHEDRINE SULFATE-NACL 50-0.9 MG/10ML-% IV SOSY
PREFILLED_SYRINGE | INTRAVENOUS | Status: DC | PRN
  Administered 2021-11-14: 10 mg via INTRAVENOUS

## 2021-11-14 MED ORDER — ACETAMINOPHEN 500 MG PO TABS
ORAL_TABLET | ORAL | Status: AC
  Filled 2021-11-14: qty 2

## 2021-11-14 MED ORDER — PROPOFOL 10 MG/ML IV BOLUS
INTRAVENOUS | Status: DC | PRN
  Administered 2021-11-14: 160 mg via INTRAVENOUS

## 2021-11-14 MED ORDER — LIDOCAINE HCL (PF) 2 % IJ SOLN
INTRAMUSCULAR | Status: AC
  Filled 2021-11-14: qty 5

## 2021-11-14 SURGICAL SUPPLY — 13 items
BAG DRAIN URO-CYSTO SKYTR STRL (DRAIN) ×2 IMPLANT
BAG DRN UROCATH (DRAIN) ×1
CLOTH BEACON ORANGE TIMEOUT ST (SAFETY) ×2 IMPLANT
ELECT REM PT RETURN 9FT ADLT (ELECTROSURGICAL) ×2
ELECTRODE REM PT RTRN 9FT ADLT (ELECTROSURGICAL) ×1 IMPLANT
GLOVE BIO SURGEON STRL SZ7.5 (GLOVE) ×2 IMPLANT
GOWN STRL REUS W/TWL LRG LVL3 (GOWN DISPOSABLE) ×2 IMPLANT
KIT TURNOVER CYSTO (KITS) ×2 IMPLANT
MANIFOLD NEPTUNE II (INSTRUMENTS) ×2 IMPLANT
NS IRRIG 500ML POUR BTL (IV SOLUTION) IMPLANT
PACK CYSTO (CUSTOM PROCEDURE TRAY) ×2 IMPLANT
TUBE CONNECTING 12X1/4 (SUCTIONS) IMPLANT
WATER STERILE IRR 3000ML UROMA (IV SOLUTION) ×2 IMPLANT

## 2021-11-14 NOTE — Interval H&P Note (Signed)
History and Physical Interval Note:  11/14/2021 11:25 AM  Keith Stone  has presented today for surgery, with the diagnosis of GROSS HEMATURIA.  The various methods of treatment have been discussed with the patient and family. After consideration of risks, benefits and other options for treatment, the patient has consented to  Procedure(s): CYSTOSCOPY WITH  BLADDER BIOPSY/ FULGURATION/ RETROGRADE (N/A) as a surgical intervention.  The patient's history has been reviewed, patient examined, no change in status, stable for surgery.  I have reviewed the patient's chart and labs.  Questions were answered to the patient's satisfaction.     Ardis Hughs

## 2021-11-14 NOTE — H&P (Signed)
61 year old male with a history of cancer status post prostatectomy in 2015 who developed biochemical recurrence and subsequently had salvage radiation in 2020. He presents today for follow-up of gross hematuria. This started approximately 2 months ago. He was treated for a urinary tract infection initially but the bleeding has persisted. He has some dysuria associated with it, but has had dysuria since the radiation. He had a CT scan performed prior to his appointment which was largely unremarkable for any etiology of his gross hematuria. He presents today for cystoscopy.     ALLERGIES: No Allergies     Notes: Allergic to red meat- [place] Tick bite   MEDICATIONS: Ambien 10 mg tablet Oral  Carvedilol 12.5 mg tablet Oral  Felodipine Er 10 mg tablet, extended release 24 hr Oral  Hydrocodone-Acetaminophen 5 mg-325 mg tablet  Losartan-Hydrochlorothiazide 100 mg-25 mg tablet Oral  Rosuvastatin Calcium     GU PSH: Laparoscopy; Lymphadenectomy - 2015 Locm 300-'399Mg'$ /Ml Iodine,1Ml - 10/02/2021 Robotic Radical Prostatectomy - 2015       PSH Notes: Laparoscopy With Bilateral Total Pelvic Lymphadenectomy, Prostatect Retropubic Radical W/ Nerve Sparing Laparoscopic, Knee Surgery   NON-GU PSH: No Non-GU PSH    GU PMH: Gross hematuria - 10/07/2021, - 10/02/2021, - 09/24/2021 History of prostate cancer - 10/07/2021, - 09/24/2021, - 04/09/2020, - 2021, - 2020, History of prostate cancer, - 2017 Primary hypogonadism - 04/09/2020, - 2017, Hypogonadism, testicular, - 2017 Stress Incontinence - 04/09/2020, Male stress incontinence, - 2015 Rising PSA after prostate cancer treatment - 2020 Prostate Cancer, Prostate cancer - 2016 Elevated PSA, PSA elevation - 2015      PMH Notes: Pretreatment prostate cancer history:   Mr. Huesman was sent to see [place] by Dr. Dagmar Hait secondary to a rising PSA. His PSA increased from 3.3-5.5 over the course of approximately 18 months. Rectal exam did reveal some nodularity in the  right lobe of the prostate.   Ultrasound revealed a 22 g prostate with some hypoechoic areas in primarily the right midportion of the prostate. Biopsies unfortunately were positive. Patient was noted to have bilateral prostate cancer. Majority of his tumor was indeed in the right lobe of the prostate where the palpable abnormality and ultrasound findings were located. Patient had 5 out of 6 biopsy cores positive on the right side. Majority of the tumor was Gleason 3 +3 = 6 but there was one core of Gleason's was 3+ 4 equals 7 in the right midportion of the prostate. On the left side to cores were positive at the left apex for Gleason's 3+3 = 6 disease. Overall the patient has an intermediate risk prostate cancer.   Clinical stage Pt2b     Treatment:   Robotic prostatectomy September 2015. Final pathology was PT2C. the patient had relatively higher volume bilateral Gleason's 3+4 = 7 cancer. Lymph nodes were negative. There was no evidence of extraprostatic extension and the seminal vesicles were negative. There was a focal positive margin at the right posterior margin     NON-GU PMH: Encounter for general adult medical examination without abnormal findings, Encounter for preventive health examination - 2016 Muscle weakness (generalized), Muscle weakness - 2015 Other lack of coordination, Muscular incoordination - 2015 Anxiety, Anxiety - 2015 Personal history of other diseases of the circulatory system, History of cardiac arrhythmia - 2015, History of hypertension, - 2015 Personal history of other diseases of the nervous system and sense organs, History of sleep apnea - 2015 Personal history of other specified conditions, History of heartburn -  2015 Intervertebral disc disorders with radiculopathy, lumbar region    FAMILY HISTORY: cardiac disorder - Runs In Family Gout - Runs In Family Hypertension - Runs In Family Kidney Stones - Runs In Family No Significant Family History - Runs In Family    SOCIAL HISTORY: Marital Status: Married Preferred Language: English; Race: White Current Smoking Status: Patient does not smoke anymore. Has not smoked since 11/22/2010.  Social Drinker.  Drinks 2 caffeinated drinks per day. 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     Notes: Activities of daily living (ADL's), independent, Exercise habits, Former smoker, Married, Occupation, Father's age, Mother's age, Caffeine use, Three children, Alcohol use   REVIEW OF SYSTEMS:    GU Review Male:   Patient denies frequent urination, hard to postpone urination, burning/ pain with urination, get up at night to urinate, leakage of urine, stream starts and stops, trouble starting your stream, have to strain to urinate , erection problems, and penile pain.  Gastrointestinal (Upper):   Patient denies indigestion/ heartburn, nausea, and vomiting.  Gastrointestinal (Lower):   Patient denies diarrhea and constipation.  Constitutional:   Patient denies fever, night sweats, weight loss, and fatigue.  Skin:   Patient denies skin rash/ lesion and itching.  Eyes:   Patient denies blurred vision and double vision.  Ears/ Nose/ Throat:   Patient denies sore throat and sinus problems.  Hematologic/Lymphatic:   Patient denies swollen glands and easy bruising.  Cardiovascular:   Patient denies leg swelling and chest pains.  Respiratory:   Patient denies cough and shortness of breath.  Endocrine:   Patient denies excessive thirst.  Musculoskeletal:   Patient denies back pain and joint pain.  Neurological:   Patient denies headaches and dizziness.  Psychologic:   Patient denies depression and anxiety.   VITAL SIGNS:      10/28/2021 12:36 PM  Weight 235 lb / 106.59 kg  Height 73 in / 185.42 cm  BP 127/79 mmHg  Pulse 69 /min  BMI 31.0 kg/m   Complexity of Data:  Source Of History:  Patient  Lab Test Review:   PSA  Records Review:   Pathology Reports, Previous Doctor Records, Previous  Patient Records, POC Tool  Urine Test Review:   Urinalysis  X-Ray Review: C.T. Abdomen/Pelvis: Reviewed Films. Discussed With Patient.     10/07/21 10/10/20 04/03/20 08/03/19 08/03/19 02/24/19 04/29/17 05/20/16  PSA  Total PSA <0.015 ng/mL 0.008 ng/ml <0.015 ng/mL 0.04 ng/mL 0.04 ng/dl 0.091 ng/mL 0.019 ng/mL < 0.015     04/03/20 04/29/17 05/20/16 12/04/15 08/08/15  Hormones  Testosterone, Total 151.3 ng/dL 293.9 ng/dL 286.2 pg/dL 178.4 pg/dL 255     PROCEDURES:         Flexible Cystoscopy - 52000  Risks, benefits, and some of the potential complications of the procedure were discussed at length with the patient including infection, bleeding, voiding discomfort, urinary retention, fever, chills, sepsis, and others. All questions were answered. Informed consent was obtained. Sterile technique and intraurethral analgesia were used.  Meatus:  Normal size. Normal location. Normal condition.  Urethra:  No strictures.  External Sphincter:  Normal.  Verumontanum:  Verumontanum Surgically Absent.  Prostate:  Prostate Surgically Absent.  Bladder Neck:  Non-obstructing.  Ureteral Orifices:  Normal location. Normal size. Normal shape. Effluxed clear urine.  Bladder:  No trabeculation. No tumors. Normal mucosa. No stones. 1.5 cm area on the left lateral wall of bolus like edema with some areas of black as well as red patches. There is  additional spot in the right posterior wall. The remaining aspect of the bladder was unremarkable.      The lower urinary tract was carefully examined. The procedure was well-tolerated and without complications. Antibiotic instructions were given. Instructions were given to call the office immediately for bloody urine, difficulty urinating, urinary retention, painful or frequent urination, fever, chills, nausea, vomiting or other illness. The patient stated that he understood these instructions and would comply with them.         Urinalysis w/Scope Dipstick Dipstick  Cont'd Micro  Color: Amber Bilirubin: Neg mg/dL WBC/hpf: 0 - 5/hpf  Appearance: Clear Ketones: Trace mg/dL RBC/hpf: 20 - 40/hpf  Specific Gravity: 1.025 Blood: 2+ ery/uL Bacteria: Few (10-25/hpf)  pH: 6.0 Protein: 2+ mg/dL Cystals: NS (Not Seen)  Glucose: Neg mg/dL Urobilinogen: 2.0 mg/dL Casts: Hyaline    Nitrites: Neg Trichomonas: Not Present    Leukocyte Esterase: Trace leu/uL Mucous: Present      Epithelial Cells: 0 - 5/hpf      Yeast: NS (Not Seen)      Sperm: Not Present    ASSESSMENT:      ICD-10 Details  1 GU:   Gross hematuria - R31.0   2   Prostate Cancer - C61      PLAN:           Document Letter(s):  Created for Patient: Clinical Summary         Notes:   The patient has an area in his bladder that is abnormal, and I am not entirely sure what it is. It may just be radiation cystitis, but I am concerned enough that this may be some sort of unusual lesion that I recommended that we proceed to the OR for bladder biopsy and fulguration. We would also obtain bilateral retrograde pyelograms. I went through this with the patient in detail, and he agreed to proceed.

## 2021-11-14 NOTE — Discharge Instructions (Addendum)
Transurethral Resection of Bladder Tumor (TURBT) or Bladder Biopsy   Definition:  Transurethral Resection of the Bladder Tumor is a surgical procedure used to diagnose and remove tumors within the bladder. TURBT is the most common treatment for early stage bladder cancer.  General instructions:     Your recent bladder surgery requires very little post hospital care but some definite precautions.  Despite the fact that no skin incisions were used, the area around the bladder incisions are raw and covered with scabs to promote healing and prevent bleeding. Certain precautions are needed to insure that the scabs are not disturbed over the next 2-4 weeks while the healing proceeds.  Because the raw surface inside your bladder and the irritating effects of urine you may expect frequency of urination and/or urgency (a stronger desire to urinate) and perhaps even getting up at night more often. This will usually resolve or improve slowly over the healing period. You may see some blood in your urine over the first 6 weeks. Do not be alarmed, even if the urine was clear for a while. Get off your feet and drink lots of fluids until clearing occurs. If you start to pass clots or don't improve call us.  Diet:  You may return to your normal diet immediately. Because of the raw surface of your bladder, alcohol, spicy foods, foods high in acid and drinks with caffeine may cause irritation or frequency and should be used in moderation. To keep your urine flowing freely and avoid constipation, drink plenty of fluids during the day (8-10 glasses). Tip: Avoid cranberry juice because it is very acidic.  Activity:  Your physical activity doesn't need to be restricted. However, if you are very active, you may see some blood in the urine. We suggest that you reduce your activity under the circumstances until the bleeding has stopped.  Bowels:  It is important to keep your bowels regular during the postoperative  period. Straining with bowel movements can cause bleeding. A bowel movement every other day is reasonable. Use a mild laxative if needed, such as milk of magnesia 2-3 tablespoons, or 2 Dulcolax tablets. Call if you continue to have problems. If you had been taking narcotics for pain, before, during or after your surgery, you may be constipated. Take a laxative if necessary.    Medication:  You should resume your pre-surgery medications unless told not to. In addition you may be given an antibiotic to prevent or treat infection. Antibiotics are not always necessary. All medication should be taken as prescribed until the bottles are finished unless you are having an unusual reaction to one of the drugs.       No acetaminophen/Tylenol until after 3:16pm today if needed for pain.     Post Anesthesia Home Care Instructions  Activity: Get plenty of rest for the remainder of the day. A responsible individual must stay with you for 24 hours following the procedure.  For the next 24 hours, DO NOT: -Drive a car -Paediatric nurse -Drink alcoholic beverages -Take any medication unless instructed by your physician -Make any legal decisions or sign important papers.  Meals: Start with liquid foods such as gelatin or soup. Progress to regular foods as tolerated. Avoid greasy, spicy, heavy foods. If nausea and/or vomiting occur, drink only clear liquids until the nausea and/or vomiting subsides. Call your physician if vomiting continues.  Special Instructions/Symptoms: Your throat may feel dry or sore from the anesthesia or the breathing tube placed in your throat during surgery.  If this causes discomfort, gargle with warm salt water. The discomfort should disappear within 24 hours.

## 2021-11-14 NOTE — Addendum Note (Signed)
Addendum  created 11/14/21 1438 by Suan Halter, CRNA   Charge Capture section accepted

## 2021-11-14 NOTE — Anesthesia Postprocedure Evaluation (Signed)
Anesthesia Post Note  Patient: Keith Stone  Procedure(s) Performed: CYSTOSCOPY WITH  BLADDER BIOPSY/ FULGURATION/ RETROGRADE (Bladder)     Patient location during evaluation: PACU Anesthesia Type: General Level of consciousness: awake and alert Pain management: pain level controlled Vital Signs Assessment: post-procedure vital signs reviewed and stable Respiratory status: spontaneous breathing, nonlabored ventilation and respiratory function stable Cardiovascular status: blood pressure returned to baseline and stable Postop Assessment: no apparent nausea or vomiting Anesthetic complications: no   No notable events documented.  Last Vitals:  Vitals:   11/14/21 1245 11/14/21 1254  BP: 119/73 123/81  Pulse:  60  Resp: 13 11  Temp: (!) 36.3 C   SpO2:  94%    Last Pain:  Vitals:   11/14/21 1254  TempSrc:   PainSc: 0-No pain                 Lidia Collum

## 2021-11-14 NOTE — Op Note (Signed)
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  There were no filling defects or hydronephrosis of either ureter. 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  There is no hydroureteronephrosis or filling defects. #3: The patient has 3 large erythematous and bullous type lesions in the bladder, the largest 1 located in the left lateral wall.  The other 2 were in the right posterior wall.  These were completely resected and sent for further analysis.  EBL: Minimal  Specimens: Left bladder wall lesion  Indication: Keith Stone is a 61 y.o. patient with gross hematuria.  He has a history of prostate cancer with biochemical recurrence and had adjuvant radiation therapy.  Cystoscopy demonstrated 3 abnormal areas within the bladder.  After reviewing the management options for treatment, he elected to proceed with the above surgical procedure(s). We have discussed the potential benefits and risks of the procedure, side effects of the proposed treatment, the likelihood of the patient achieving the goals of the procedure, and any potential problems that might occur during the procedure or recuperation. Informed consent has been obtained.  Description of procedure:  The patient was taken to the operating room and general anesthesia was induced.  The patient was placed in the dorsal lithotomy position, prepped and draped in the usual sterile fashion, and preoperative antibiotics were administered. A preoperative time-out was performed.   21 French 30 degree cystoscope was gently passed  to the patient's urethra and into the bladder under visual guidance.  Cystoscopy was performed with the above findings.  I subsequently performed retrograde pyelograms with a 5 [place] open-ended ureteral catheter and 10 cc of Omnipaque contrast with the above findings.  I then dilated the patient's urethral meatus to 28 [place] so that I could easily accommodate the 26 [place] resectoscope.  This was passing with the visual obturator.  I then exchanged it for the loop cautery element.  I resected the areas of concern and fulgurated them copiously.  There was no bleeding, hemostasis was noted to be excellent.  The bladder was subsequently emptied and the patient was awoken and returned the PACU in stable condition.  Ardis Hughs, M.D.

## 2021-11-14 NOTE — Transfer of Care (Signed)
Immediate Anesthesia Transfer of Care Note  Patient: Keith Stone  Procedure(s) Performed: Procedure(s) (LRB): CYSTOSCOPY WITH  BLADDER BIOPSY/ FULGURATION/ RETROGRADE (N/A)  Patient Location: PACU  Anesthesia Type: General  Level of Consciousness: awake, oriented, sedated and patient cooperative  Airway & Oxygen Therapy: Patient Spontanous Breathing and Patient connected to face mask oxygen  Post-op Assessment: Report given to PACU RN and Post -op Vital signs reviewed and stable  Post vital signs: Reviewed and stable  Complications: No apparent anesthesia complications  Last Vitals:  Vitals Value Taken Time  BP 126/85 11/14/21 1230  Temp 36.3 C 11/14/21 1226  Pulse 65 11/14/21 1230  Resp 12 11/14/21 1230  SpO2 95 % 11/14/21 1230  Vitals shown include unvalidated device data.  Last Pain:  Vitals:   11/14/21 1226  TempSrc:   PainSc: 0-No pain      Patients Stated Pain Goal: 3 (60/60/04 5997)  Complications: No notable events documented.

## 2021-11-14 NOTE — Anesthesia Preprocedure Evaluation (Signed)
Anesthesia Evaluation  Patient identified by MRN, date of birth, ID band Patient awake    Reviewed: Allergy & Precautions, NPO status , Patient's Chart, lab work & pertinent test results, reviewed documented beta blocker date and time   History of Anesthesia Complications Negative for: history of anesthetic complications  Airway Mallampati: II  TM Distance: >3 FB Neck ROM: Full    Dental  (+) Teeth Intact, Dental Advisory Given   Pulmonary sleep apnea , Current Smoker,    Pulmonary exam normal        Cardiovascular hypertension, Pt. on medications and Pt. on home beta blockers Normal cardiovascular exam+ dysrhythmias Supra Ventricular Tachycardia      Neuro/Psych negative neurological ROS     GI/Hepatic Neg liver ROS, GERD  ,  Endo/Other  negative endocrine ROS  Renal/GU negative Renal ROS   Gross hematuria H/o prostate cancer s/p prostatectomy/XRT    Musculoskeletal  (+) Arthritis , Osteoarthritis,    Abdominal   Peds  Hematology negative hematology ROS (+)   Anesthesia Other Findings   Reproductive/Obstetrics                            Anesthesia Physical Anesthesia Plan  ASA: 2  Anesthesia Plan: General   Post-op Pain Management: Tylenol PO (pre-op)* and Toradol IV (intra-op)*   Induction: Intravenous  PONV Risk Score and Plan: 1 and Ondansetron, Dexamethasone, Midazolam and Treatment may vary due to age or medical condition  Airway Management Planned: LMA  Additional Equipment: None  Intra-op Plan:   Post-operative Plan: Extubation in OR  Informed Consent: I have reviewed the patients History and Physical, chart, labs and discussed the procedure including the risks, benefits and alternatives for the proposed anesthesia with the patient or authorized representative who has indicated his/her understanding and acceptance.     Dental advisory given  Plan Discussed  with:   Anesthesia Plan Comments:         Anesthesia Quick Evaluation

## 2021-11-14 NOTE — Anesthesia Procedure Notes (Signed)
Procedure Name: Intubation Date/Time: 11/14/2021 11:46 AM Performed by: Suan Halter, CRNA Pre-anesthesia Checklist: Patient identified, Emergency Drugs available, Suction available and Patient being monitored Patient Re-evaluated:Patient Re-evaluated prior to induction Oxygen Delivery Method: Circle system utilized Preoxygenation: Pre-oxygenation with 100% oxygen Induction Type: IV induction Ventilation: Mask ventilation without difficulty Laryngoscope Size: Mac and 3 Grade View: Grade II Tube type: Oral Tube size: 7.0 mm Number of attempts: 1 Airway Equipment and Method: Stylet and Oral airway Placement Confirmation: ETT inserted through vocal cords under direct vision, positive ETCO2 and breath sounds checked- equal and bilateral Secured at: 22 cm Tube secured with: Tape Dental Injury: Teeth and Oropharynx as per pre-operative assessment

## 2021-11-15 ENCOUNTER — Encounter (HOSPITAL_BASED_OUTPATIENT_CLINIC_OR_DEPARTMENT_OTHER): Payer: Self-pay | Admitting: Urology

## 2021-11-15 LAB — SURGICAL PATHOLOGY

## 2021-11-28 DIAGNOSIS — N393 Stress incontinence (female) (male): Secondary | ICD-10-CM | POA: Diagnosis not present

## 2021-11-28 DIAGNOSIS — R310 Gross hematuria: Secondary | ICD-10-CM | POA: Diagnosis not present

## 2021-11-28 DIAGNOSIS — C61 Malignant neoplasm of prostate: Secondary | ICD-10-CM | POA: Diagnosis not present

## 2022-01-16 ENCOUNTER — Ambulatory Visit (INDEPENDENT_AMBULATORY_CARE_PROVIDER_SITE_OTHER): Payer: BC Managed Care – PPO | Admitting: Neurology

## 2022-01-16 ENCOUNTER — Encounter: Payer: Self-pay | Admitting: Neurology

## 2022-01-16 VITALS — BP 119/73 | HR 69 | Ht 73.0 in | Wt 232.2 lb

## 2022-01-16 DIAGNOSIS — E669 Obesity, unspecified: Secondary | ICD-10-CM

## 2022-01-16 DIAGNOSIS — G4719 Other hypersomnia: Secondary | ICD-10-CM | POA: Diagnosis not present

## 2022-01-16 DIAGNOSIS — G4733 Obstructive sleep apnea (adult) (pediatric): Secondary | ICD-10-CM | POA: Diagnosis not present

## 2022-01-16 DIAGNOSIS — Z820 Family history of epilepsy and other diseases of the nervous system: Secondary | ICD-10-CM | POA: Diagnosis not present

## 2022-01-16 DIAGNOSIS — R351 Nocturia: Secondary | ICD-10-CM

## 2022-01-16 NOTE — Progress Notes (Signed)
Subjective:    Patient ID: Keith Stone is a 61 y.o. male.  HPI    Star Age, MD, PhD [facility] [address], [address] [place], [place] [address]  Dear Dr. Dagmar Hait,   I saw your patient, Keith Stone, upon your kind request in my sleep clinic today for initial consultation of his sleep disorder, in particular, concern for obstructive sleep apnea, and reevaluation of her prior diagnosis.  The patient is unaccompanied today.  As you know, Mr. Favorite is a 61 year old right-handed gentleman with an underlying medical history of asthma, reflux disease, palpitations, allergic rhinitis, prostate cancer, prior smoking with recent cessation, palpitations, and borderline obesity, who reports snoring and excessive daytime somnolence. I reviewed your office note from 11/12/2021.  He was diagnosed with sleep apnea and placed on PAP therapy several years ago.  He is currently not on PAP therapy for years.  He brought his old CPAP machine, he has a Mudlogger, had trouble tolerating the nasal mask, was getting his supplies online, testing was probably around 15 years ago in [place] as he recalls.   He has had weight gain and significant weight fluctuation in the past few years.  His Epworth sleepiness score is 3 out of 24, fatigue severity score is 19 out of 63.  He does not wake up rested.  He is tired during the day but does not have a tendency to doze off.  He takes care of all of his family his mom who is 106.  He lives with his wife, his 49 year old son lives with them, he has sleep apnea as well.  Patient reports a further family history of sleep apnea affecting his dad who had a CPAP before he passed and his brother who has a CPAP machine.  Patient goes to bed between 10 and 11 and rise time is between 7:30 AM and 8 AM.  He has nocturia about 2-3 times per average night.  He has a history of radiation cystitis.  He denies recurrent morning or nocturnal  headaches.  He drinks caffeine in the form of coffee, typically 1 cup/day, quit smoking earlier this month, drinks alcohol very occasionally, maybe once a month.  His snoring has been loud at times, he has woken up with a sense of gasping, his wife has noted pauses in his breathing which concern her.  He would like to pursue a home sleep test.  He had a tonsillectomy as a child.  His Past Medical History Is Significant For: Past Medical History:  Diagnosis Date   Allergic rhinitis    Biochemically recurrent malignant neoplasm of prostate ([place]) 02/2019   urologist--- dr herrick/  radiation oncologist--- dr Tammi Klippel   first dx 2015  s/p robot radical prostatectomy (Gleason 3+4);   recurrent elevated PSA ,  completed IMRT 05-12-2019   Dysuria    ED (erectile dysfunction)    GERD (gastroesophageal reflux disease)    occ uses alka seltzer   Gross hematuria    History of adenomatous polyp of colon    History of asthma    child   History of COVID-19 06/2019   per pt mild symptoms that resolved   History of external beam radiation therapy    prostatic fossa   03-22-2019  to 05-12-2019   Hypertension    Insomnia    Lower urinary tract symptoms (LUTS)    OA (osteoarthritis)    OSA (obstructive sleep apnea)    per pt no  cpap since approx   Palpitations    cardiologist-- dr Marlou Porch   Wears contact lenses     His Past Surgical History Is Significant For: Past Surgical History:  Procedure Laterality Date   BILATERAL KNEE ARTHROSCOPY     1996;  1998   COLONOSCOPY WITH PROPOFOL  04/04/2021   Dr.Stark   CYSTOSCOPY WITH BIOPSY N/A 11/14/2021   Procedure: CYSTOSCOPY WITH  BLADDER BIOPSY/ FULGURATION/ RETROGRADE;  Surgeon: Ardis Hughs, MD;  Location[facility];  Service: Urology;  Laterality: N/A;   LYMPHADENECTOMY Bilateral 02/22/2014   Procedure: PELVIC LYMPH NODE DISSECTION;  Surgeon: Bernestine Amass, MD;  Location: [facility];  Service: Urology;  Laterality: Bilateral;    ROBOT ASSISTED LAPAROSCOPIC RADICAL PROSTATECTOMY N/A 02/22/2014   Procedure: ROBOTIC ASSISTED LAPAROSCOPIC RADICAL PROSTATECTOMY;  Surgeon: Bernestine Amass, MD;  Location: [facility];  Service: Urology;  Laterality: N/A;   TONSILLECTOMY     child   TYMPANOSTOMY TUBE PLACEMENT Bilateral    several times as child and last time approx 2008    His Family History Is Significant For: Family History  Problem Relation Age of Onset   Arthritis Mother    Fibromyalgia Mother    Food Allergy Mother    Colon polyps Father    Lung cancer Father    Sleep apnea Father    Hypertension Brother    Colon polyps Brother    Skin cancer Brother    Sleep apnea Brother    Lymphoma Paternal Uncle    Coronary artery disease Maternal Grandmother    Coronary artery disease Maternal Grandfather    Cancer Maternal Grandfather    Sleep apnea Son    Colon cancer Neg Hx    Esophageal cancer Neg Hx    Stomach cancer Neg Hx    Rectal cancer Neg Hx    Allergic rhinitis Neg Hx    Angioedema Neg Hx    Asthma Neg Hx    Eczema Neg Hx    Immunodeficiency Neg Hx    Urticaria Neg Hx     His Social History Is Significant For: Social History   Socioeconomic History   Marital status: Married    Spouse name: Not on file   Number of children: 3   Years of education: Not on file   Highest education level: Not on file  Occupational History   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  Tobacco Use   Smoking status: Every Day    Packs/day: 1.00    Years: 46.00    Total pack years: 46.00    Types: E-cigarettes, Cigarettes    Last attempt to quit: 12/21/2021    Years since quitting: 0.0   Smokeless tobacco: Never   Tobacco comments:    Smoke cigarettes since age 29  Vaping Use   Vaping Use: Every day  Substance and Sexual Activity   Alcohol use: Yes    Comment: occasional   Drug use: No   Sexual activity: Yes    Birth control/protection: Surgical    Comment: vasectomy in 2000  Other Topics Concern   Not  on file  Social History Narrative   Not on file   Social Determinants of Health   Financial Resource Strain: Not on file  Food Insecurity: Not on file  Transportation Needs: Not on file  Physical Activity: Not on file  Stress: Not on file  Social Connections: Not on file    His Allergies Are:  Allergies  Allergen Reactions  Alpha-Gal Anaphylaxis    This includes beef, pork, and lamb  :   His Current Medications Are:  Outpatient Encounter Medications as of 01/16/2022  Medication Sig   aspirin 81 MG chewable tablet Chew 81 mg by mouth daily.   carvedilol (COREG) 12.5 MG tablet Take 12.5 mg by mouth 2 (two) times daily with a meal.   cetirizine (ZYRTEC) 10 MG tablet Take 10 mg by mouth daily as needed.   EPINEPHrine 0.3 mg/0.3 mL IJ SOAJ injection Inject 0.3 mg into the muscle as needed for anaphylaxis.   felodipine (PLENDIL) 10 MG 24 hr tablet Take 10 mg by mouth daily.   losartan-hydrochlorothiazide (HYZAAR) 100-25 MG per tablet Take 0.5 tablets by mouth 2 (two) times daily.   Omega-3 Fatty Acids (FISH OIL) 1200 MG CAPS Take by mouth as needed.   Probiotic Product (PROBIOTIC PO) Take by mouth 2 (two) times a week.   traMADol (ULTRAM) 50 MG tablet Take 1-2 tablets (50-100 mg total) by mouth every 6 (six) hours as needed for moderate pain.   zolpidem (AMBIEN) 10 MG tablet Take 10 mg by mouth at bedtime as needed.   albuterol (VENTOLIN HFA) 108 (90 Base) MCG/ACT inhaler Inhale 2 puffs into the lungs every 6 (six) hours as needed for wheezing or shortness of breath.   rosuvastatin (CRESTOR) 20 MG tablet Take 1 tablet (20 mg total) by mouth daily. (Patient taking differently: Take 20 mg by mouth daily.)   No facility-administered encounter medications on file as of 01/16/2022.  :   Review of Systems:  Out of a complete 14 point review of systems, all are reviewed and negative with the exception of these symptoms as listed below:  Review of Systems  Neurological:        Pt here  for Sleep Consult  Pt states snores,stop breathing during the night ,little fatigue Pt denies Hypertension,headaches. Pt states had sleep study 15 years ago and has not used CPAP in seven years    ESS:3 FSS:19    Objective:  Neurological Exam  Physical Exam Physical Examination:   Vitals:   01/16/22 1324  BP: 119/73  Pulse: 69   General Examination: The patient is a very pleasant 61 y.o. male in no acute distress. He appears well-developed and well-nourished and well groomed.   HEENT: Normocephalic, atraumatic, pupils are equal, round and reactive to light, extraocular tracking is good without limitation to gaze excursion or nystagmus noted. Hearing is grossly intact. Face is symmetric with normal facial animation. Speech is clear with no dysarthria noted. There is no hypophonia. There is no lip, neck/head, jaw or voice tremor. Neck is supple with full range of passive and active motion. There are no carotid bruits on auscultation. Oropharynx exam reveals: No significant mouth dryness, good dental hygiene, moderate airway crowding secondary to redundant soft palate and larger uvula, Mallampati class III.  Tonsils absent.  Neck circumference of 17 1/4 inches.  Tongue protrudes centrally and palate elevates symmetrically.  Minimal overbite.  Nasal inspection reveals narrow nasal passages, no obvious septal deviation.  Chest: Clear to auscultation without wheezing, rhonchi or crackles noted.  Heart: S1+S2+0, regular and normal without murmurs, rubs or gallops noted.   Abdomen: Soft, non-tender and non-distended.  Extremities: There is no pitting edema in the distal lower extremities bilaterally.   Skin: Warm and dry without trophic changes noted.   Musculoskeletal: exam reveals no obvious joint deformities.   Neurologically:  Mental status: The patient is awake, alert and oriented in  all 4 spheres. His immediate and remote memory, attention, language skills and fund of knowledge are  appropriate. There is no evidence of aphasia, agnosia, apraxia or anomia. Speech is clear with normal prosody and enunciation. Thought process is linear. Mood is normal and affect is normal.  Cranial nerves II - XII are as described above under HEENT exam.  Motor exam: Normal bulk, strength and tone is noted. There is no tremor, Romberg is negative. Reflexes are 2+ throughout. Fine motor skills and coordination: grossly intact.  Cerebellar testing: No dysmetria or intention tremor. There is no truncal or gait ataxia.  Sensory exam: intact to light touch in the upper and lower extremities.  Gait, station and balance: He stands easily. No veering to one side is noted. No leaning to one side is noted. Posture is age-appropriate and stance is narrow based. Gait shows normal stride length and normal pace. No problems turning are noted.   Assessment and plan:   In summary, SANAV REMER is a very pleasant 61 y.o.-year old male with an underlying medical history of asthma, reflux disease, palpitations, allergic rhinitis, prostate cancer, smoking, palpitations, and borderline obesity, who presents for evaluation of his obstructive sleep apnea.  He carries a prior diagnosis of OSA but has not been on PAP therapy for years.  He reports nonrestorative sleep.    I had a long chat with the patient about my findings and the diagnosis of sleep apnea, particularly OSA, its prognosis and treatment options. We talked about medical/conservative treatments, surgical interventions and non-pharmacological approaches for symptom control. I explained, in particular, the risks and ramifications of untreated moderate to severe OSA, especially with respect to developing cardiovascular disease down the road, including congestive heart failure (CHF), difficult to treat hypertension, cardiac arrhythmias (particularly A-fib), neurovascular complications including TIA, stroke and dementia. Even type 2 diabetes has, in part, been  linked to untreated OSA. Symptoms of untreated OSA may include (but may not be limited to) daytime sleepiness, nocturia (i.e. frequent nighttime urination), memory problems, mood irritability and suboptimally controlled or worsening mood disorder such as depression and/or anxiety, lack of energy, lack of motivation, physical discomfort, as well as recurrent headaches, especially morning or nocturnal headaches. We talked about the importance of maintaining a healthy lifestyle and striving for healthy weight.  The importance of ongoing smoking cessation was also addressed. I recommended the following at this time: sleep study.  I outlined the differences between a laboratory attended sleep study which is considered more comprehensive and accurate over the option of a home sleep test (HST); the latter may lead to underestimation of sleep disordered breathing in some instances and does not help with diagnosing upper airway resistance syndrome and is not accurate enough to diagnose primary central sleep apnea typically.  He would prefer a home sleep test which I ordered. I explained the different sleep test procedures to the patient in detail and also outlined possible surgical and non-surgical treatment options of OSA, including the use of a pressure airway pressure (PAP) device (ie CPAP, AutoPAP/APAP or BiPAP in certain circumstances), a custom-made dental device (aka oral appliance, which would require a referral to a specialist dentist or orthodontist typically, and is generally speaking not considered a good choice for patients with full dentures or edentulous state), upper airway surgical options, such as traditional UPPP (which is not considered a first-line treatment) or the Inspire device (hypoglossal nerve stimulator, which would involve a referral for consultation with an ENT surgeon, after careful selection, following  inclusion criteria). I explained the PAP treatment option to the patient in detail, as this  is generally considered first-line treatment.  The patient indicated that he would be willing to try PAP therapy again, if the need arises.  He is not keen on exploring any surgical options.  I explained the importance of being compliant with PAP treatment, not only for insurance purposes but primarily to improve patient's symptoms symptoms, and for the patient's long term health benefit, including to reduce His cardiovascular risks longer-term.    We will pick up our discussion about the next steps and treatment options after testing.  We will keep him posted as to the test results by phone call and/or MyChart messaging where possible.  We will plan to follow-up in sleep clinic accordingly as well.  I answered all his questions today and the patient was in agreement.   I encouraged him to call with any interim questions, concerns, problems or updates or email [place] through [id number].  Generally speaking, sleep test authorizations may take up to 2 weeks, sometimes less, sometimes longer, the patient is encouraged to get in touch with [place] if they do not hear back from the sleep lab staff directly within the next 2 weeks.  Thank you very much for allowing me to participate in the care of this nice patient. If I can be of any further assistance to you please do not hesitate to call me at [phone].  Sincerely,   Star Age, MD, PhD

## 2022-01-16 NOTE — Patient Instructions (Signed)

## 2022-01-29 ENCOUNTER — Telehealth: Payer: Self-pay | Admitting: Neurology

## 2022-01-29 NOTE — Telephone Encounter (Signed)
HST- [employer] Josem Kaufmann: [id number] (exp. 01/28/22 to 03/28/22)  Patient is scheduled at [facility] for 02/25/22 at 8:30 AM.  Mailed packet to the patient.

## 2022-02-15 ENCOUNTER — Emergency Department (HOSPITAL_COMMUNITY)
Admission: EM | Admit: 2022-02-15 | Discharge: 2022-02-15 | Discharge status: Against Medical Advice | Payer: BC Managed Care – PPO | Source: Home / Self Care

## 2022-02-25 ENCOUNTER — Ambulatory Visit (INDEPENDENT_AMBULATORY_CARE_PROVIDER_SITE_OTHER): Payer: BC Managed Care – PPO | Admitting: Neurology

## 2022-02-25 DIAGNOSIS — G4733 Obstructive sleep apnea (adult) (pediatric): Secondary | ICD-10-CM

## 2022-02-25 DIAGNOSIS — R351 Nocturia: Secondary | ICD-10-CM

## 2022-02-25 DIAGNOSIS — G4719 Other hypersomnia: Secondary | ICD-10-CM

## 2022-02-25 DIAGNOSIS — Z820 Family history of epilepsy and other diseases of the nervous system: Secondary | ICD-10-CM

## 2022-02-25 DIAGNOSIS — E669 Obesity, unspecified: Secondary | ICD-10-CM

## 2022-03-04 ENCOUNTER — Telehealth: Payer: Self-pay

## 2022-03-04 NOTE — Procedures (Signed)
[facility]  HOME SLEEP TEST (Watch PAT) REPORT  STUDY DATE: 02/25/2022  DOB: December 12, 1960  MRN: [id number]  ORDERING CLINICIAN: Star Age, MD, PhD   REFERRING CLINICIANPrince Solian, MD   CLINICAL INFORMATION/HISTORY:  61 year old man with a medical history of asthma, reflux disease, palpitations, allergic rhinitis, prostate cancer, prior smoking with recent cessation, palpitations, and borderline obesity, who reports snoring and excessive daytime somnolence. He was diagnosed with sleep apnea and placed on PAP therapy several years ago.  He had not been on PAP therapy for years.    Epworth sleepiness score: 3/24.  BMI: 30.7 kg/m  FINDINGS:   Sleep Summary:   Total Recording Time (hours, min): 8 hours, 54 min  Total Sleep Time (hours, min):  8 hours, 25 min  Percent REM (%):    25.5%   Respiratory Indices:   Calculated pAHI (per hour):  37.1/hour         REM pAHI:    39.4/hour       NREM pAHI: 36.3/hour  Central pAHI: 2.2/hour  Oxygen Saturation Statistics:    Oxygen Saturation (%) Mean: 93%   Minimum oxygen saturation (%):                 76%   O2 Saturation Range (%): 76-99%    O2 Saturation (minutes) <=88%: 19.1 min  Pulse Rate Statistics:   Pulse Mean (bpm):    57/min    Pulse Range (44-98/min)   IMPRESSION: OSA (obstructive sleep apnea), severe  RECOMMENDATION:  This home sleep test demonstrates severe obstructive sleep apnea with a total AHI of 37.1/hour and O2 nadir of 76% with significant time below or at 88% saturation of nearly 20 minutes for the night.  Snoring was in the mild to moderate range. Treatment with positive airway pressure is highly recommended. This will require - ideally - a full night CPAP titration study for proper treatment settings, O2 monitoring and mask fitting. For now, the patient will be advised to proceed with an autoPAP titration/trial at home.  A laboratory attended titration study can be  considered in the future for optimization of his treatment and better tolerance of therapy.  Alternative treatment options are limited secondary to the severity of the patient's sleep disordered breathing, but may include treatment with a hypoglossal nerve stimulator with careful patient selection. Please note, that untreated obstructive sleep apnea may carry additional perioperative morbidity. Patients with significant obstructive sleep apnea should receive perioperative PAP therapy and the surgeons and particularly the anesthesiologist should be informed of the diagnosis and the severity of the sleep disordered breathing. The patient should be cautioned not to drive, work at heights, or operate dangerous or heavy equipment when tired or sleepy. Review and reiteration of good sleep hygiene measures should be pursued with any patient. Other causes of the patient's symptoms, including circadian rhythm disturbances, an underlying mood disorder, medication effect and/or an underlying medical problem cannot be ruled out based on this test. Clinical correlation is recommended. The patient and his referring provider will be notified of the test results. The patient will be seen in follow up in sleep clinic at [facility].  I certify that I have reviewed the raw data recording prior to the issuance of this report in accordance with the standards of the American Academy of Sleep Medicine (AASM).   INTERPRETING PHYSICIAN:   Star Age, MD, PhD  Board Certified in Neurology and Sleep Medicine  [facility] [address] [place], [place]  [address] [phone]           

## 2022-03-04 NOTE — Telephone Encounter (Signed)
-----   Message from Star Age, MD sent at 03/04/2022  9:22 AM EDT ----- Patient referred by Dr. Dagmar Hait for re-eval of his OSA, seen by me on 01/16/22, patient had a HST on 02/25/22.    Please call and notify the patient that the recent home sleep test showed obstructive sleep apnea in the severe range. I recommend treatment for this in the form of autoPAP, which means, that we don't have to bring him in for a sleep study with CPAP, but will let him start using a so called autoPAP machine at home, through a DME company (of his choice, or as per insurance requirement). The DME representative will fit the patient with a mask of choice, educate him on how to use the machine, how to put the mask on, etc. I have placed an order in the chart. Please send the order to a local DME, talk to patient, send report to referring MD. Please also reinforce the need for compliance with treatment. We will need a FU in sleep clinic for 10 weeks post-PAP set up, please arrange that with me or one of our NPs. Thanks,   Star Age, MD, PhD [facility])

## 2022-03-04 NOTE — Telephone Encounter (Signed)
I called pt. I advised pt that Dr. Rexene Alberts reviewed their sleep study results and found that pt has severe osa. Dr. Rexene Alberts recommends that pt start an auto-pap at home. I reviewed PAP compliance expectations with the pt. Pt is agreeable to starting an auto-PAP. I advised pt that an order will be sent to a DME, [facility], and [facility] will call the pt within about one week after they file with the pt's insurance. [facility] will show the pt how to use the machine, fit for masks, and troubleshoot the auto-PAP if needed. A follow up appt was made for insurance purposes with Judson Roch, NP on 06/03/22 at 12:45pm. Pt verbalized understanding to arrive 15 minutes early and bring their auto-PAP. A letter with all of this information in it will be mailed to the pt as a reminder. I verified with the pt that the address we have on file is correct. Pt verbalized understanding of results. Pt had no questions at this time but was encouraged to call back if questions arise. I have sent the order to [facility] and have received confirmation that they have received the order.

## 2022-03-04 NOTE — Addendum Note (Signed)
Addended by: Star Age on: 03/04/2022 09:22 AM   Modules accepted: Orders

## 2022-03-13 DIAGNOSIS — G4733 Obstructive sleep apnea (adult) (pediatric): Secondary | ICD-10-CM | POA: Diagnosis not present

## 2022-03-19 ENCOUNTER — Other Ambulatory Visit: Payer: Self-pay | Admitting: Cardiology

## 2022-04-01 DIAGNOSIS — I10 Essential (primary) hypertension: Secondary | ICD-10-CM | POA: Diagnosis not present

## 2022-04-12 DIAGNOSIS — G4733 Obstructive sleep apnea (adult) (pediatric): Secondary | ICD-10-CM | POA: Diagnosis not present

## 2022-05-13 DIAGNOSIS — G4733 Obstructive sleep apnea (adult) (pediatric): Secondary | ICD-10-CM | POA: Diagnosis not present

## 2022-06-03 ENCOUNTER — Telehealth (INDEPENDENT_AMBULATORY_CARE_PROVIDER_SITE_OTHER): Payer: BC Managed Care – PPO | Admitting: Neurology

## 2022-06-03 ENCOUNTER — Telehealth: Payer: Self-pay | Admitting: Neurology

## 2022-06-03 DIAGNOSIS — C61 Malignant neoplasm of prostate: Secondary | ICD-10-CM | POA: Diagnosis not present

## 2022-06-03 DIAGNOSIS — R8279 Other abnormal findings on microbiological examination of urine: Secondary | ICD-10-CM | POA: Diagnosis not present

## 2022-06-03 DIAGNOSIS — G4733 Obstructive sleep apnea (adult) (pediatric): Secondary | ICD-10-CM | POA: Diagnosis not present

## 2022-06-03 DIAGNOSIS — R8271 Bacteriuria: Secondary | ICD-10-CM | POA: Diagnosis not present

## 2022-06-03 NOTE — Progress Notes (Signed)
Virtual Visit via Telephone Note  I connected with [name] on 06/03/22 at 12:45 PM EST by telephone and verified that I am speaking with the correct person using two identifiers.   I discussed the limitations, risks, security and privacy concerns of performing an evaluation and management service by telephone and the availability of in person appointments. I also discussed with the patient that there may be a patient responsible charge related to this service. The patient expressed understanding and agreed to proceed.  Patient location: Home Provider location: In office   History of Present Illness: HISTORY OF PRESENT ILLNESS: Today 06/03/22 Keith Stone had HST 02/25/22 showing in severe OSA. This home sleep test demonstrates severe obstructive sleep apnea with a total AHI of 37.1/hour and O2 nadir of 76% with significant time below or at 88% saturation of nearly 20 minutes for the night.   Today we proceeded with telephone visit, he couldn't get logged into my chart. He doesn't love using CPAP. Has chronic insomnia, some nights he doesn't sleep. He no longer snores. He can't say that his energy level has increased or that his sleep quality is better, but that wasn't a concern to prompt sleep study consultation.   Using full face mask with nasal cushion. Denies any leaking. He works as Hotel manager with local travel.   Review of data 05/04/22-06/02/22 uses 22/30 days at 73%, greater than 4 hours 19 days 63%.  Average usage days used 5 hours 46 minutes.  Minimum pressure 6, maximum pressure 14.  Leak in the 95 percentile 34.9, AHI 6.4.  95th percentile pressure 10.2.     HISTORY  Dr. Rexene Alberts 01/16/22: I saw your patient, Keith Stone, upon your kind request in my sleep clinic today for initial consultation of his sleep disorder, in particular, concern for obstructive sleep apnea, and reevaluation of her prior diagnosis.  The patient is unaccompanied today.  As you know, Keith Stone  is a 61 year old right-handed gentleman with an underlying medical history of asthma, reflux disease, palpitations, allergic rhinitis, prostate cancer, prior smoking with recent cessation, palpitations, and borderline obesity, who reports snoring and excessive daytime somnolence. I reviewed your office note from 11/12/2021.  He was diagnosed with sleep apnea and placed on PAP therapy several years ago.  He is currently not on PAP therapy for years.  He brought his old CPAP machine, he has a Mudlogger, had trouble tolerating the nasal mask, was getting his supplies online, testing was probably around 15 years ago in [place] as he recalls.   He has had weight gain and significant weight fluctuation in the past few years.  His Epworth sleepiness score is 3 out of 24, fatigue severity score is 19 out of 63.  He does not wake up rested.  He is tired during the day but does not have a tendency to doze off.  He takes care of all of his family his mom who is 1.  He lives with his wife, his 74 year old son lives with them, he has sleep apnea as well.  Patient reports a further family history of sleep apnea affecting his dad who had a CPAP before he passed and his brother who has a CPAP machine.  Patient goes to bed between 10 and 11 and rise time is between 7:30 AM and 8 AM.  He has nocturia about 2-3 times per average night.  He has a history of radiation cystitis.  He denies recurrent morning or nocturnal headaches.  He drinks caffeine in the form of coffee, typically 1 cup/day, quit smoking earlier this month, drinks alcohol very occasionally, maybe once a month.  His snoring has been loud at times, he has woken up with a sense of gasping, his wife has noted pauses in his breathing which concern her.  He would like to pursue a home sleep test.  He had a tonsillectomy as a child.   Observations/Objective: Via telephone visit is alert and oriented, speech is clear and concise  Assessment and Plan: 1.   Severe OSA on CPAP -Discussed the importance of improving nightly compliance with CPAP for a minimum of 4 hours -I will order a mask refit, leak is 34.9, AHI 6.4 -I will pull a download in about 5 weeks  Follow Up Instructions: I will pull a download in 5 weeks, call him   I discussed the assessment and treatment plan with the patient. The patient was provided an opportunity to ask questions and all were answered. The patient agreed with the plan and demonstrated an understanding of the instructions.   The patient was advised to call back or seek an in-person evaluation if the symptoms worsen or if the condition fails to improve as anticipated.  I spent 15 minutes during this telephone call.  Evangeline Dakin, DNP  [facility] [address] [place], [place] [address] [phone]

## 2022-06-03 NOTE — Patient Instructions (Signed)
Please work on nightly use of CPAP for a minimum of 4 hours I will order a mask refit.  It appears your mask is leaking to some degree.  I will follow-up with you in about 5 weeks.

## 2022-06-03 NOTE — Telephone Encounter (Signed)
..   Pt understands that although there may be some limitations with this type of visit, we will take all precautions to reduce any security or privacy concerns.  Pt understands that this will be treated like an in office visit and we will file with pt's insurance, and there may be a patient responsible charge related to this service. ? ?

## 2022-06-05 ENCOUNTER — Telehealth: Payer: Self-pay

## 2022-06-05 NOTE — Telephone Encounter (Signed)
CPAP order sent to [facility] for processing.

## 2022-06-12 DIAGNOSIS — G4733 Obstructive sleep apnea (adult) (pediatric): Secondary | ICD-10-CM | POA: Diagnosis not present

## 2022-07-08 ENCOUNTER — Encounter: Payer: Self-pay | Admitting: Neurology

## 2022-07-28 DIAGNOSIS — H1045 Other chronic allergic conjunctivitis: Secondary | ICD-10-CM | POA: Diagnosis not present

## 2022-07-29 DIAGNOSIS — F17200 Nicotine dependence, unspecified, uncomplicated: Secondary | ICD-10-CM | POA: Diagnosis not present

## 2022-07-29 DIAGNOSIS — C61 Malignant neoplasm of prostate: Secondary | ICD-10-CM | POA: Diagnosis not present

## 2022-09-16 DIAGNOSIS — D2362 Other benign neoplasm of skin of left upper limb, including shoulder: Secondary | ICD-10-CM | POA: Diagnosis not present

## 2022-09-16 DIAGNOSIS — D225 Melanocytic nevi of trunk: Secondary | ICD-10-CM | POA: Diagnosis not present

## 2022-09-16 DIAGNOSIS — L578 Other skin changes due to chronic exposure to nonionizing radiation: Secondary | ICD-10-CM | POA: Diagnosis not present

## 2022-09-16 DIAGNOSIS — L570 Actinic keratosis: Secondary | ICD-10-CM | POA: Diagnosis not present

## 2022-09-16 DIAGNOSIS — L821 Other seborrheic keratosis: Secondary | ICD-10-CM | POA: Diagnosis not present

## 2022-11-11 DIAGNOSIS — L821 Other seborrheic keratosis: Secondary | ICD-10-CM | POA: Diagnosis not present

## 2022-11-11 DIAGNOSIS — D225 Melanocytic nevi of trunk: Secondary | ICD-10-CM | POA: Diagnosis not present

## 2022-11-11 DIAGNOSIS — D2262 Melanocytic nevi of left upper limb, including shoulder: Secondary | ICD-10-CM | POA: Diagnosis not present

## 2022-11-26 DIAGNOSIS — K219 Gastro-esophageal reflux disease without esophagitis: Secondary | ICD-10-CM | POA: Diagnosis not present

## 2022-11-26 DIAGNOSIS — Z0000 Encounter for general adult medical examination without abnormal findings: Secondary | ICD-10-CM | POA: Diagnosis not present

## 2022-11-26 DIAGNOSIS — E785 Hyperlipidemia, unspecified: Secondary | ICD-10-CM | POA: Diagnosis not present

## 2022-11-26 DIAGNOSIS — R7989 Other specified abnormal findings of blood chemistry: Secondary | ICD-10-CM | POA: Diagnosis not present

## 2022-11-26 DIAGNOSIS — I10 Essential (primary) hypertension: Secondary | ICD-10-CM | POA: Diagnosis not present

## 2022-11-26 DIAGNOSIS — Z125 Encounter for screening for malignant neoplasm of prostate: Secondary | ICD-10-CM | POA: Diagnosis not present

## 2022-11-26 DIAGNOSIS — E291 Testicular hypofunction: Secondary | ICD-10-CM | POA: Diagnosis not present

## 2022-12-03 DIAGNOSIS — R82998 Other abnormal findings in urine: Secondary | ICD-10-CM | POA: Diagnosis not present

## 2022-12-03 DIAGNOSIS — Z1331 Encounter for screening for depression: Secondary | ICD-10-CM | POA: Diagnosis not present

## 2022-12-03 DIAGNOSIS — I10 Essential (primary) hypertension: Secondary | ICD-10-CM | POA: Diagnosis not present

## 2022-12-03 DIAGNOSIS — Z1339 Encounter for screening examination for other mental health and behavioral disorders: Secondary | ICD-10-CM | POA: Diagnosis not present

## 2022-12-03 DIAGNOSIS — Z0000 Encounter for general adult medical examination without abnormal findings: Secondary | ICD-10-CM | POA: Diagnosis not present

## 2023-04-13 DIAGNOSIS — C61 Malignant neoplasm of prostate: Secondary | ICD-10-CM | POA: Diagnosis not present

## 2023-04-13 DIAGNOSIS — E785 Hyperlipidemia, unspecified: Secondary | ICD-10-CM | POA: Diagnosis not present

## 2023-04-13 DIAGNOSIS — R1013 Epigastric pain: Secondary | ICD-10-CM | POA: Diagnosis not present

## 2023-04-13 DIAGNOSIS — Z0000 Encounter for general adult medical examination without abnormal findings: Secondary | ICD-10-CM | POA: Diagnosis not present

## 2023-04-13 DIAGNOSIS — Z0189 Encounter for other specified special examinations: Secondary | ICD-10-CM | POA: Diagnosis not present

## 2023-05-26 ENCOUNTER — Encounter: Payer: Self-pay | Admitting: Gastroenterology

## 2023-05-26 ENCOUNTER — Other Ambulatory Visit (INDEPENDENT_AMBULATORY_CARE_PROVIDER_SITE_OTHER): Payer: Self-pay

## 2023-05-26 ENCOUNTER — Ambulatory Visit (INDEPENDENT_AMBULATORY_CARE_PROVIDER_SITE_OTHER): Payer: BC Managed Care – PPO | Admitting: Gastroenterology

## 2023-05-26 ENCOUNTER — Other Ambulatory Visit: Payer: Self-pay

## 2023-05-26 VITALS — BP 110/70 | HR 68 | Ht 73.0 in | Wt 221.0 lb

## 2023-05-26 DIAGNOSIS — Z87891 Personal history of nicotine dependence: Secondary | ICD-10-CM

## 2023-05-26 DIAGNOSIS — Z91018 Allergy to other foods: Secondary | ICD-10-CM

## 2023-05-26 DIAGNOSIS — K219 Gastro-esophageal reflux disease without esophagitis: Secondary | ICD-10-CM | POA: Diagnosis not present

## 2023-05-26 DIAGNOSIS — C61 Malignant neoplasm of prostate: Secondary | ICD-10-CM | POA: Diagnosis not present

## 2023-05-26 DIAGNOSIS — R634 Abnormal weight loss: Secondary | ICD-10-CM

## 2023-05-26 DIAGNOSIS — R1013 Epigastric pain: Secondary | ICD-10-CM | POA: Diagnosis not present

## 2023-05-26 DIAGNOSIS — G4733 Obstructive sleep apnea (adult) (pediatric): Secondary | ICD-10-CM

## 2023-05-26 DIAGNOSIS — Z860101 Personal history of adenomatous and serrated colon polyps: Secondary | ICD-10-CM

## 2023-05-26 DIAGNOSIS — Z8546 Personal history of malignant neoplasm of prostate: Secondary | ICD-10-CM

## 2023-05-26 LAB — COMPREHENSIVE METABOLIC PANEL
ALT: 16 U/L (ref 0–53)
AST: 16 U/L (ref 0–37)
Albumin: 4.5 g/dL (ref 3.5–5.2)
Alkaline Phosphatase: 35 U/L — ABNORMAL LOW (ref 39–117)
BUN: 12 mg/dL (ref 6–23)
CO2: 30 meq/L (ref 19–32)
Calcium: 9.5 mg/dL (ref 8.4–10.5)
Chloride: 102 meq/L (ref 96–112)
Creatinine, Ser: 0.85 mg/dL (ref 0.40–1.50)
GFR: 93.38 mL/min (ref >60.00)
Glucose, Bld: 94 mg/dL (ref 70–99)
Potassium: 3.3 meq/L — ABNORMAL LOW (ref 3.5–5.1)
Sodium: 140 meq/L (ref 135–145)
Total Bilirubin: 0.9 mg/dL (ref 0.2–1.2)
Total Protein: 7.3 g/dL (ref 6.0–8.3)

## 2023-05-26 LAB — TSH: TSH: 0.93 u[IU]/mL (ref 0.35–5.50)

## 2023-05-26 LAB — LIPASE: Lipase: 36 U/L (ref 11.0–59.0)

## 2023-05-26 NOTE — Progress Notes (Signed)
    Assessment     Epigastric abdominal pain, weight loss, GERD.  R/O ulcer, esophagitis, gastritis, neoplasm. Alpha-gal syndrome Personal history of adenomatous and sessile serrated colon polyps Prostate cancer S/P radical prostatectomy and salvage radiotherapy OSA   Recommendations    CMP, lipase, TSH, tTG, IgA Schedule CT AP Schedule EGD. The risks (including bleeding, perforation, infection, missed lesions, medication reactions and possible hospitalization or surgery if complications occur), benefits, and alternatives to endoscopy with possible biopsy and possible dilation were discussed with the patient and they consent to proceed.   Surveillance colonoscopy recommended in October 2025 REV with Dr. Doy Hutching in January    HPI    This is a 62 year old male with epigastric pain, nausea, loss of appetite, unintentional weight loss since August.  He notes about a 25 pound unintentional weight loss.  His appetite is decreased and he has significant nausea.  Occasionally would he will have dry heaves in the mornings.  CMP, CBC in October were normal.  He relates no change in diet or medications.  He was taking omeprazole without improvement in symptoms.  He was switched to pantoprazole which has had a minimal impact on symptoms.  He was diagnosed with alpha gal 3 years ago and has avoided red meat since then.  Denies weight loss, abdominal pain, constipation, diarrhea, change in stool caliber, melena, hematochezia, nausea, vomiting, dysphagia, reflux symptoms, chest pain.    Labs / Imaging       Latest Ref Rng & Units 06/11/2021   12:00 AM  Hepatic Function  ALT 0 - 44 IU/L 19        Latest Ref Rng & Units 11/14/2021    9:27 AM 03/12/2020    7:05 PM 03/12/2020    5:01 PM  CBC  WBC 4.0 - 10.5 K/uL   13.9   Hemoglobin 13.0 - 17.0 g/dL 08.6  57.8  46.9   Hematocrit 39.0 - 52.0 % 48.0  49.0  49.6   Platelets 150 - 400 K/uL   348     Current Medications, Allergies, Past Medical  History, Past Surgical History, Family History and Social History were reviewed in [employer] record.   Physical Exam: General: Well developed, well nourished, no acute distress Head: Normocephalic and atraumatic Eyes: Sclerae anicteric, EOMI Ears: Normal auditory acuity Mouth: No deformities or lesions noted Lungs: Clear throughout to auscultation Heart: Regular rate and rhythm; No murmurs, rubs or bruits Abdomen: Soft, non tender and non distended. No masses, hepatosplenomegaly or hernias noted. Normal Bowel sounds Rectal: Not done Musculoskeletal: Symmetrical with no gross deformities  Pulses:  Normal pulses noted Extremities: No edema or deformities noted Neurological: Alert oriented x 4, grossly nonfocal Psychological:  Alert and cooperative. Normal mood and affect   Jaree Trinka T. Russella Dar, MD 05/26/2023, 10:12 AM

## 2023-05-26 NOTE — Patient Instructions (Signed)
Your provider has requested that you go to the basement level for lab work before leaving today. Press "B" on the elevator. The lab is located at the first door on the left as you exit the elevator.  You have been scheduled for an endoscopy. Please follow written instructions given to you at your visit today.  If you use inhalers (even only as needed), please bring them with you on the day of your procedure.  If you take any of the following medications, they will need to be adjusted prior to your procedure:   DO NOT TAKE 7 DAYS PRIOR TO TEST- Trulicity (dulaglutide) Ozempic, Wegovy (semaglutide) Mounjaro (tirzepatide) Bydureon Bcise (exanatide extended release)  DO NOT TAKE 1 DAY PRIOR TO YOUR TEST Rybelsus (semaglutide) Adlyxin (lixisenatide) Victoza (liraglutide) Byetta (exanatide) ___________________________________________________________________________  You have been scheduled for a CT scan of the abdomen and pelvis at [facility], 1st floor Radiology. You are scheduled on 06/08/23 arriving at 12pm. The test will start at 2:30pm.  Please follow the written instructions below on the day of your exam:   1) Do not eat anything after 10:30am (4 hours prior to your test)   The purpose of you drinking the oral contrast is to aid in the visualization of your intestinal tract. The contrast solution may cause some diarrhea. Depending on your individual set of symptoms, you may also receive an intravenous injection of x-ray contrast/dye. Plan on being at [facility] for 45 minutes or longer, depending on the type of exam you are having performed.   If you have any questions regarding your exam or if you need to reschedule, you may call [facility] at [phone] between the hours of 8:00 am and 5:00 pm, Monday-Friday.   The [facility] providers would like to encourage you to use [facility] to communicate with providers for non-urgent requests or questions.  Due to long  hold times on the telephone, sending your provider a message by [facility] may be a faster and more efficient way to get a response.  Please allow 48 business hours for a response.  Please remember that this is for non-urgent requests.   Due to recent changes in healthcare laws, you may see the results of your imaging and laboratory studies on MyChart before your provider has had a chance to review them.  We understand that in some cases there may be results that are confusing or concerning to you. Not all laboratory results come back in the same time frame and the provider may be waiting for multiple results in order to interpret others.  Please give [place] 48 hours in order for your provider to thoroughly review all the results before contacting the office for clarification of your results.   Thank you for choosing me and [facility].  Venita Lick. Pleas Koch., MD., Clementeen Graham

## 2023-05-27 ENCOUNTER — Encounter: Payer: Self-pay | Admitting: Gastroenterology

## 2023-05-27 LAB — TISSUE TRANSGLUTAMINASE, IGA: (tTG) Ab, IgA: <1 U/mL

## 2023-05-27 LAB — IGA: Immunoglobulin A: 224 mg/dL (ref 70–320)

## 2023-05-28 ENCOUNTER — Telehealth: Payer: Self-pay

## 2023-05-28 NOTE — Telephone Encounter (Signed)
-----   Message from [place] T. Russella Dar sent at 05/28/2023 10:55 AM EST ----- Regarding: FW: tTG negative ----- Message ----- From: Interface, Lab In Three [facility] One Sent: 05/27/2023  12:54 AM EST To: Meryl Dare, MD

## 2023-05-28 NOTE — Telephone Encounter (Signed)
The pt has been advised via My Chart- he does view his messages

## 2023-06-01 ENCOUNTER — Encounter: Payer: Self-pay | Admitting: Gastroenterology

## 2023-06-01 ENCOUNTER — Ambulatory Visit (AMBULATORY_SURGERY_CENTER): Payer: BC Managed Care – PPO | Admitting: Gastroenterology

## 2023-06-01 VITALS — BP 134/80 | HR 54 | Temp 98.1°F | Resp 8 | Ht 73.0 in | Wt 221.0 lb

## 2023-06-01 DIAGNOSIS — K3189 Other diseases of stomach and duodenum: Secondary | ICD-10-CM

## 2023-06-01 DIAGNOSIS — R1013 Epigastric pain: Secondary | ICD-10-CM | POA: Diagnosis not present

## 2023-06-01 DIAGNOSIS — R634 Abnormal weight loss: Secondary | ICD-10-CM | POA: Diagnosis not present

## 2023-06-01 DIAGNOSIS — K219 Gastro-esophageal reflux disease without esophagitis: Secondary | ICD-10-CM | POA: Diagnosis not present

## 2023-06-01 DIAGNOSIS — K449 Diaphragmatic hernia without obstruction or gangrene: Secondary | ICD-10-CM

## 2023-06-01 MED ORDER — SODIUM CHLORIDE 0.9 % IV SOLN: 500.0000 mL | Freq: Once | INTRAVENOUS | Status: DC

## 2023-06-01 NOTE — Op Note (Signed)
[facility] Patient Name: Keith Stone Procedure Date: 06/01/2023 8:27 AM MRN: [id number] Endoscopist: Meryl Dare , MD, [phone] Age: 62 Referring MD:  Date of Birth: [birth year] Gender: Male Account #: [id number] Procedure:                Upper GI endoscopy Indications:              Epigastric abdominal pain, Weight loss Medicines:                Monitored Anesthesia Care Procedure:                Pre-Anesthesia Assessment:                           - Prior to the procedure, a History and Physical                            was performed, and patient medications and                            allergies were reviewed. The patient's tolerance of                            previous anesthesia was also reviewed. The risks                            and benefits of the procedure and the sedation                            options and risks were discussed with the patient.                            All questions were answered, and informed consent                            was obtained. Prior Anticoagulants: The patient has                            taken no anticoagulant or antiplatelet agents. ASA                            Grade Assessment: II - A patient with mild systemic                            disease. After reviewing the risks and benefits,                            the patient was deemed in satisfactory condition to                            undergo the procedure.                           After obtaining informed consent, the endoscope was  passed under direct vision. Throughout the                            procedure, the patient's blood pressure, pulse, and                            oxygen saturations were monitored continuously. The                            GIF [id number] #[id number] was introduced through the                            mouth, and advanced to the second part of duodenum.                            The upper GI  endoscopy was accomplished without                            difficulty. The patient tolerated the procedure                            well. Scope In: Scope Out: Findings:                 The examined esophagus was normal.                           A small hiatal hernia was present.                           The gastroesophageal flap valve was visualized                            endoscopically and classified as Hill Grade I                            (prominent fold, tight to endoscope).                           Patchy mildly erythematous mucosa without bleeding                            was found in the gastric body and in the gastric                            antrum. Biopsies were taken with a cold forceps for                            histology.                           The exam of the stomach was otherwise normal.                           The duodenal bulb and second portion of the  duodenum were normal. Complications:            No immediate complications. Estimated Blood Loss:     Estimated blood loss was minimal. Impression:               - Normal esophagus.                           - Small hiatal hernia.                           - Gastroesophageal flap valve classified as Hill                            Grade I (prominent fold, tight to endoscope).                           - Erythematous mucosa in the gastric body and                            antrum. Biopsied.                           - Normal duodenal bulb and second portion of the                            duodenum. Recommendation:           - Patient has a contact number available for                            emergencies. The signs and symptoms of potential                            delayed complications were discussed with the                            patient. Return to normal activities tomorrow.                            Written discharge instructions were provided to the                             patient.                           - Resume previous diet.                           - Continue present medications.                           - Await pathology results.                           - Proceed with CT AP as scheduled. Meryl Dare, MD 06/01/2023 8:45:50 AM This report has been signed electronically.

## 2023-06-01 NOTE — Progress Notes (Signed)
Called to room to assist during endoscopic procedure.  Patient ID and intended procedure confirmed with present staff. Received instructions for my participation in the procedure from the performing physician.  

## 2023-06-01 NOTE — Patient Instructions (Addendum)
Recommendation:           - Patient has a contact number available for                            emergencies. The signs and symptoms of potential                            delayed complications were discussed with the                            patient. Return to normal activities tomorrow.                            Written discharge instructions were provided to the                            patient.                           - Resume previous diet.                           - Continue present medications.                           - Await pathology results.                           - Proceed with CT AP as scheduled.  YOU HAD AN ENDOSCOPIC PROCEDURE TODAY AT THE [facility]:   Refer to the procedure report that was given to you for any specific questions about what was found during the examination.  If the procedure report does not answer your questions, please call your gastroenterologist to clarify.  If you requested that your care partner not be given the details of your procedure findings, then the procedure report has been included in a sealed envelope for you to review at your convenience later.  YOU SHOULD EXPECT: Some feelings of bloating in the abdomen. Passage of more gas than usual.  Walking can help get rid of the air that was put into your GI tract during the procedure and reduce the bloating. If you had a lower endoscopy (such as a colonoscopy or flexible sigmoidoscopy) you may notice spotting of blood in your stool or on the toilet paper. If you underwent a bowel prep for your procedure, you may not have a normal bowel movement for a few days.  Please Note:  You might notice some irritation and congestion in your nose or some drainage.  This is from the oxygen used during your procedure.  There is no need for concern and it should clear up in a day or so.  SYMPTOMS TO REPORT IMMEDIATELY:  Following upper endoscopy (EGD)  Vomiting of blood or coffee ground  material  New chest pain or pain under the shoulder blades  Painful or persistently difficult swallowing  New shortness of breath  Fever of 100F or higher  Black, tarry-looking stools  For urgent or emergent issues, a gastroenterologist can be reached at any hour by calling [phone]. Do not use MyChart messaging for urgent concerns.  DIET:  We do recommend a small meal at first, but then you may proceed to your regular diet.  Drink plenty of fluids but you should avoid alcoholic beverages for 24 hours.  ACTIVITY:  You should plan to take it easy for the rest of today and you should NOT DRIVE or use heavy machinery until tomorrow (because of the sedation medicines used during the test).    FOLLOW UP: Our staff will call the number listed on your records the next business day following your procedure.  We will call around 7:15- 8:00 am to check on you and address any questions or concerns that you may have regarding the information given to you following your procedure. If we do not reach you, we will leave a message.     If any biopsies were taken you will be contacted by phone or by letter within the next 1-3 weeks.  Please call us at [phone] if you have not heard about the biopsies in 3 weeks.    SIGNATURES/CONFIDENTIALITY: You and/or your care partner have signed paperwork which will be entered into your electronic medical record.  These signatures attest to the fact that that the information above on your After Visit Summary has been reviewed and is understood.  Full responsibility of the confidentiality of this discharge information lies with you and/or your care-partner.

## 2023-06-01 NOTE — Progress Notes (Signed)
See 05/26/2023 H&P no changes

## 2023-06-01 NOTE — Progress Notes (Signed)
Report to PACU, RN, vss, BBS= Clear.  

## 2023-06-01 NOTE — Progress Notes (Signed)
Pt's states no medical or surgical changes since previsit or office visit. 

## 2023-06-02 ENCOUNTER — Telehealth: Payer: Self-pay

## 2023-06-02 DIAGNOSIS — Z8546 Personal history of malignant neoplasm of prostate: Secondary | ICD-10-CM | POA: Diagnosis not present

## 2023-06-02 DIAGNOSIS — N393 Stress incontinence (female) (male): Secondary | ICD-10-CM | POA: Diagnosis not present

## 2023-06-02 NOTE — Telephone Encounter (Signed)
  Follow up Call-     06/01/2023    7:48 AM 04/04/2021    3:29 PM  Call back number  Post procedure Call Back phone  # [phone]  Permission to leave phone message Yes Yes     Patient questions:  Do you have a fever, pain , or abdominal swelling? No. Pain Score  0 *  Have you tolerated food without any problems? Yes.    Have you been able to return to your normal activities? Yes.    Do you have any questions about your discharge instructions: Diet   No. Medications  No. Follow up visit  No.  Do you have questions or concerns about your Care? No.  Actions: * If pain score is 4 or above: No action needed, pain <4.

## 2023-06-03 LAB — SURGICAL PATHOLOGY

## 2023-06-08 ENCOUNTER — Ambulatory Visit (HOSPITAL_COMMUNITY)
Admission: RE | Admit: 2023-06-08 | Discharge: 2023-06-08 | Disposition: A | Discharge status: Home / Self Care | Payer: BC Managed Care – PPO | Source: Ambulatory Visit | Attending: Gastroenterology | Admitting: Gastroenterology

## 2023-06-08 ENCOUNTER — Encounter (HOSPITAL_COMMUNITY): Payer: Self-pay

## 2023-06-08 DIAGNOSIS — R634 Abnormal weight loss: Secondary | ICD-10-CM | POA: Insufficient documentation

## 2023-06-08 DIAGNOSIS — R109 Unspecified abdominal pain: Secondary | ICD-10-CM | POA: Diagnosis not present

## 2023-06-08 DIAGNOSIS — K5730 Diverticulosis of large intestine without perforation or abscess without bleeding: Secondary | ICD-10-CM | POA: Diagnosis not present

## 2023-06-08 DIAGNOSIS — R1013 Epigastric pain: Secondary | ICD-10-CM | POA: Insufficient documentation

## 2023-06-08 MED ORDER — IOHEXOL 300 MG/ML  SOLN
100.0000 mL | Freq: Once | INTRAMUSCULAR | Status: AC | PRN
  Administered 2023-06-08: 100 mL via INTRAVENOUS

## 2023-06-08 MED ORDER — IOHEXOL 300 MG/ML  SOLN
30.0000 mL | Freq: Once | INTRAMUSCULAR | Status: AC | PRN
  Administered 2023-06-08: 30 mL via ORAL

## 2023-06-09 ENCOUNTER — Other Ambulatory Visit: Payer: Self-pay

## 2023-06-09 DIAGNOSIS — R1013 Epigastric pain: Secondary | ICD-10-CM

## 2023-06-09 DIAGNOSIS — R634 Abnormal weight loss: Secondary | ICD-10-CM

## 2023-06-11 ENCOUNTER — Telehealth: Payer: Self-pay | Admitting: Gastroenterology

## 2023-06-11 NOTE — Telephone Encounter (Signed)
Inbound call from patient requesting a call to discuss 12/9 endoscopy. Please advise, thank you.

## 2023-06-12 NOTE — Telephone Encounter (Signed)
The pt has been advised that the path has not been reviewed. We will contact him as soon as Dr Russella Dar is back in the office next week and has made recommendations.

## 2023-06-12 NOTE — Telephone Encounter (Signed)
Patient called stated Dr. Russella Dar told him to call today if he did not hear from anyone regarding his results.

## 2023-06-12 NOTE — Telephone Encounter (Signed)
The pt has been advised that Dr Russella Dar has not reviewed the pathology as of today. He is aware that we will contact him as soon as able.  The pt has been advised of the information and verbalized understanding.

## 2023-06-18 ENCOUNTER — Encounter: Payer: Self-pay | Admitting: Gastroenterology

## 2023-06-22 DIAGNOSIS — Z1389 Encounter for screening for other disorder: Secondary | ICD-10-CM | POA: Diagnosis not present

## 2023-06-22 DIAGNOSIS — Z0000 Encounter for general adult medical examination without abnormal findings: Secondary | ICD-10-CM | POA: Diagnosis not present

## 2023-06-22 DIAGNOSIS — C61 Malignant neoplasm of prostate: Secondary | ICD-10-CM | POA: Diagnosis not present

## 2023-06-22 DIAGNOSIS — Z91018 Allergy to other foods: Secondary | ICD-10-CM | POA: Diagnosis not present

## 2023-06-22 DIAGNOSIS — R1013 Epigastric pain: Secondary | ICD-10-CM | POA: Diagnosis not present

## 2023-06-22 DIAGNOSIS — I10 Essential (primary) hypertension: Secondary | ICD-10-CM | POA: Diagnosis not present

## 2023-07-01 DIAGNOSIS — H05223 Edema of bilateral orbit: Secondary | ICD-10-CM | POA: Diagnosis not present

## 2023-07-02 DIAGNOSIS — F419 Anxiety disorder, unspecified: Secondary | ICD-10-CM | POA: Diagnosis not present

## 2023-07-02 DIAGNOSIS — R42 Dizziness and giddiness: Secondary | ICD-10-CM | POA: Diagnosis not present

## 2023-07-08 NOTE — Progress Notes (Signed)
[facility] Return Visit   Referring Provider Chilton Greathouse, MD [address] [place],  [place] [address]  Primary Care Provider Chilton Greathouse, MD  Patient Profile: Keith Stone is a 63 y.o. male with a past medical history noteworthy for OSA, prostate cancer status post radical prostatectomy and salvage radiotherapy, asthma who returns to the [facility] for follow-up of the problem(s) noted below.  Problem List: Epigastric abdominal pain, weight loss, GERD.  Loose stool Alpha-gal syndrome Personal history of adenomatous and sessile serrated colon polyps  History of Present Illness   [name] was last seen in the GI office 05/26/2023 by Dr. Russella Dar   Current GI Meds  Seed probiotic  Interval History  Since [name]'s last visit to the office he underwent CTAP and EGD both of which were unremarkable  He returns to the office today reporting that his symptoms of nausea, abdominal pain and weight loss have significantly improved since dietary modification Over the holidays he reduced dairy and cheese in his diet States that he had previously been eating only 1 time a day due to his busy schedule and stress -now back to eating 3 meals a day His pain is now "nonexistent" No nausea or vomiting He discontinued pantoprazole because of improvement in his symptoms and the fact that he was not experiencing GERD  Review of his weight indicates that his weight loss is stabilized -now at 219 pounds States that on his home scale he typically weighs 205-207 pounds  The only symptom of concern to him today are changes in his stools described as being light green and sometimes watery Typically has bowel movements in the morning and describes intermittent fecal urgency No melena, hematochezia, tenesmus or nocturnal bowel movements Last colonoscopy was performed 03/2021 with 5 polyps as below -due for next colonoscopy fall 2025 No family history of  gastrointestinal disorders  Has a history of alpha gal syndrome that primarily manifests as itching and throat swelling No overt GI symptoms  Relates that he has experienced symptoms of lightheadedness and dizziness Recent CT scan 07/02/2023 showed remote appearing lacunar infarct on the right side, prominence of optic nerve sheaths without overt radiographic papilledema Hoarce relates that he is being followed by a neurologist and has been advised to have a follow-up MRI of his brain  Last colonoscopy: 03/2021 - 5 polyps (TA + SSA), mild diverticulosis, internal hemorrhoids Last endoscopy: 05/2023 -patchy erythema in gastric body, small HH; normal biopsies  Last Abd CT/CTE/MRE: CTAP 05/2023 -no acute abnormality in abdomen or pelvis, left-sided colonic diverticulosis without diverticulitis  GI Review of Symptoms Significant for loose stool. Otherwise negative.  General Review of Systems  Review of systems is significant for the pertinent positives and negatives as listed per the HPI.  Full ROS is otherwise negative.  Past Medical History   Past Medical History:  Diagnosis Date   Allergic rhinitis    Biochemically recurrent malignant neoplasm of prostate (HCC) 02/2019   urologist--- dr herrick/  radiation oncologist--- dr Kathrynn Running   first dx 2015  s/p robot radical prostatectomy (Gleason 3+4);   recurrent elevated PSA ,  completed IMRT 05-12-2019   Dysuria    ED (erectile dysfunction)    GERD (gastroesophageal reflux disease)    occ uses alka seltzer   Gross hematuria    History of adenomatous polyp of colon    History of asthma    child   History of COVID-19 06/2019   per pt mild symptoms that resolved  History of external beam radiation therapy    prostatic fossa   03-22-2019  to 05-12-2019   Hypertension    Insomnia    Lower urinary tract symptoms (LUTS)    OA (osteoarthritis)    OSA (obstructive sleep apnea)    per pt no cpap since approx   Palpitations     cardiologist-- dr Anne Fu   Stroke [facility])    Wears contact lenses      Past Surgical History   Past Surgical History:  Procedure Laterality Date   BILATERAL KNEE ARTHROSCOPY     1996;  1998   COLONOSCOPY WITH PROPOFOL  04/04/2021   Dr.Stark   CYSTOSCOPY WITH BIOPSY N/A 11/14/2021   Procedure: CYSTOSCOPY WITH  BLADDER BIOPSY/ FULGURATION/ RETROGRADE;  Surgeon: Crist Fat, MD;  Location: [facility];  Service: Urology;  Laterality: N/A;   LYMPHADENECTOMY Bilateral 02/22/2014   Procedure: PELVIC LYMPH NODE DISSECTION;  Surgeon: Valetta Fuller, MD;  Location: [facility];  Service: Urology;  Laterality: Bilateral;   ROBOT ASSISTED LAPAROSCOPIC RADICAL PROSTATECTOMY N/A 02/22/2014   Procedure: ROBOTIC ASSISTED LAPAROSCOPIC RADICAL PROSTATECTOMY;  Surgeon: Valetta Fuller, MD;  Location: [facility];  Service: Urology;  Laterality: N/A;   TONSILLECTOMY     child   TYMPANOSTOMY TUBE PLACEMENT Bilateral    several times as child and last time approx 2008     Allergies and Medications   Allergies  Allergen Reactions   Alpha-Gal Anaphylaxis    This includes beef, pork, and lamb    Current Meds  Medication Sig   cetirizine (ZYRTEC) 10 MG tablet Take 10 mg by mouth daily as needed.   felodipine (PLENDIL) 10 MG 24 hr tablet Take 10 mg by mouth daily.   losartan-hydrochlorothiazide (HYZAAR) 100-25 MG per tablet Take 0.5 tablets by mouth 2 (two) times daily.   prednisoLONE acetate (PRED FORTE) 1 % ophthalmic suspension Place 1 drop into both eyes as needed.   Probiotic Product (PROBIOTIC PO) Take by mouth 2 (two) times a week.   zolpidem (AMBIEN) 10 MG tablet Take 10 mg by mouth at bedtime as needed.    Family History   Family History  Problem Relation Age of Onset   Arthritis Mother    Fibromyalgia Mother    Food Allergy Mother    Colon polyps Father    Lung cancer Father    Sleep apnea Father    Hypertension Brother    Colon polyps Brother    Skin cancer Brother     Sleep apnea Brother    Lymphoma Paternal Uncle    Coronary artery disease Maternal Grandmother    Coronary artery disease Maternal Grandfather    Cancer Maternal Grandfather    Sleep apnea Son    Colon cancer Neg Hx    Esophageal cancer Neg Hx    Stomach cancer Neg Hx    Rectal cancer Neg Hx    Allergic rhinitis Neg Hx    Angioedema Neg Hx    Asthma Neg Hx    Eczema Neg Hx    Immunodeficiency Neg Hx    Urticaria Neg Hx     Social History   Social History   Tobacco Use   Smoking status: Every Day    Current packs/day: 0.00    Average packs/day: 1 pack/day for 46.0 years (46.0 ttl pk-yrs)    Types: E-cigarettes, Cigarettes    Start date: 12/22/1975    Last attempt to quit: 12/21/2021    Years since quitting: 1.5  Smokeless tobacco: Current   Tobacco comments:    Smoke cigarettes since age 76  Vaping Use   Vaping status: Every Day  Substance Use Topics   Alcohol use: Yes    Comment: occasional   Drug use: No   Drae reports that he has been smoking e-cigarettes and cigarettes. He started smoking about 47 years ago. He has a 46 pack-year smoking history. He uses smokeless tobacco. He reports current alcohol use. He reports that he does not use drugs.  Vital Signs and Physical Examination   Vitals:   07/09/23 0945  BP: 120/76  Pulse: 72  Height: 6\' 1"  (1.854 m)  Weight: 219 lb (99.3 kg)  SpO2: 98%  BMI (Calculated): 28.9    General: Well developed, well nourished, no acute distress Head: Normocephalic and atraumatic Eyes: Sclerae anicteric, EOMI Mouth: No deformities or lesions noted Lungs: Clear throughout to auscultation Heart: Regular rate and rhythm; No murmurs, rubs or bruits Abdomen: Soft, non tender and non distended. No masses, hepatosplenomegaly or hernias noted. Normal Bowel sounds Rectal: Deferred Musculoskeletal: Symmetrical with no gross deformities  Pulses:  Normal pulses noted Extremities: No edema or deformities noted Neurological: Alert  oriented x 4, grossly nonfocal Psychological:  Alert and cooperative. Normal mood and affect   Review of Data  The following data was reviewed at the time of this encounter:  Laboratory Studies      Latest Ref Rng & Units 11/14/2021    9:27 AM 03/12/2020    7:05 PM 03/12/2020    5:01 PM  CBC  WBC 4.0 - 10.5 K/uL   13.9   Hemoglobin 13.0 - 17.0 g/dL 16.1  09.6  04.5   Hematocrit 39.0 - 52.0 % 48.0  49.0  49.6   Platelets 150 - 400 K/uL   348     Lab Results  Component Value Date   LIPASE 36.0 05/26/2023      Latest Ref Rng & Units 05/26/2023   11:20 AM 11/14/2021    9:27 AM 06/11/2021   12:00 AM  CMP  Glucose 70 - 99 mg/dL 94  409    BUN 6 - 23 mg/dL 12  10    Creatinine 8.11 - 1.50 mg/dL 9.14  7.82    Sodium 956 - 145 mEq/L 140  141    Potassium 3.5 - 5.1 mEq/L 3.3  3.0    Chloride 96 - 112 mEq/L 102  100    CO2 19 - 32 mEq/L 30     Calcium 8.4 - 10.5 mg/dL 9.5     Total Protein 6.0 - 8.3 g/dL 7.3     Total Bilirubin 0.2 - 1.2 mg/dL 0.9     Alkaline Phos 39 - 117 U/L 35     AST 0 - 37 U/L 16     ALT 0 - 53 U/L 16   19    Imaging Studies  CTAP 06/13/2023 1. No acute abnormality in the abdomen or pelvis. 2. Left-sided colonic diverticulosis without findings of acute diverticulitis. 3.  Aortic Atherosclerosis (ICD10-I70.0).  GI Procedures and Studies  EGD 06/01/2023 Patchy erythema in gastric body, small HH Path: Normal gastric biopsies  Colonoscopy 04/04/2021  5 polyps (TA + SSA), mild diverticulosis, internal hemorrhoids  Colonoscopy 06/26/2017 - Three 6 to 7 mm polyps in the transverse colon and in the cecum, removed with a cold snare. Resected and retrieved.  - Two 4 to 5 mm polyps in the transverse colon, removed with a cold biopsy forceps. Resected  and retrieved.  - Mild diverticulosis in the left colon. There was evidence of diverticular spasm. There was no evidence of diverticular bleeding.  - The examination was otherwise normal on direct and retroflexion  views.  Path:  - TUBULAR ADENOMA (3 OF 7 FRAGMENTS) - BENIGN COLONIC MUCOSA (4 OF 7 FRAGMENTS) - NO HIGH GRADE DYSPLASIA OR MALIGNANCY IDENTIFIED  Colonoscopy 08/26/2011  Path - TUBULAR ADENOMA (ONE FRAGMENT). - HYPERPLASTIC POLYP (MULTIPLE FRAGMENTS). - POLYPOID FRAGMENT OF BENIGN COLONIC MUCOSA. - NO HIGH GRADE DYSPLASIA OR MALIGNANCY.  Clinical Impression  It is my clinical impression that Mr. Beatley is a 63 y.o. male with;  Epigastric abdominal pain, weight loss, GERD.  Loose stool Alpha-gal syndrome Personal history of adenomatous and sessile serrated colon polyps  Orvel returns to the office today to follow-up recent symptoms of epigastric abdominal pain, weight loss and GERD.  CTAP and EGD were unremarkable.  After dietary modification by reducing dairy and cheese in his diet his symptoms have essentially resolved.  He has ceased his proton pump inhibitor.  His weight loss has stabilized.  He is generally feeling better with the exception of changes in his bowels with looser stools.  He showed me pictures of his stools in the office today which appeared consistent with small but formed soft stools-not frank diarrhea and no evidence of blood.  It is possible some of his previous symptoms were related to an infection and he may be experiencing some postinfectious irritable bowel type changes.  He is on a probiotic and I have recommended continuing dietary modification as well as his probiotic.  He is due for colonoscopy later this year.  Advised that if his stools are not improving over the next 1 to 2 months to notify my office and we can consider moving up his colonoscopy both for polyp surveillance and to assess for changes in his bowel habits.  It is noteworthy that recent CT scan showed possibility of a remote lacunar infarct and he is currently undergoing neurologic evaluation.  Recommended completing that evaluation before scheduling any procedures that require sedation.  Plan   Continue seed probiotic Continue dietary modification reducing dairy and cheese in diet If loose stools persist over the next 1 to 2 months can consider moving up the date of his next surveillance colonoscopy due spring 2025 for further evaluation for polyps and changes in his bowel habits. Monitor weight and anthropometrics   Planned Follow Up Return if symptoms worsen or fail to improve.  The patient or caregiver verbalized understanding of the material covered, with no barriers to understanding. All questions were answered. Patient or caregiver is agreeable with the plan outlined above.    It was a pleasure to see Edyn.  If you have any questions or concerns regarding this evaluation, do not hesitate to contact me.  Maren Beach, MD [facility]   I spent total of 30  minutes in both face-to-face and non-face-to-face activities, excluding procedures performed, for the visit on the date of this encounter.

## 2023-07-09 ENCOUNTER — Encounter: Payer: Self-pay | Admitting: Pediatrics

## 2023-07-09 ENCOUNTER — Ambulatory Visit: Payer: BC Managed Care – PPO | Admitting: Pediatrics

## 2023-07-09 VITALS — BP 120/76 | HR 72 | Ht 73.0 in | Wt 219.0 lb

## 2023-07-09 DIAGNOSIS — R634 Abnormal weight loss: Secondary | ICD-10-CM

## 2023-07-09 DIAGNOSIS — R1013 Epigastric pain: Secondary | ICD-10-CM | POA: Diagnosis not present

## 2023-07-09 DIAGNOSIS — Z91014 Allergy to mammalian meats: Secondary | ICD-10-CM

## 2023-07-09 DIAGNOSIS — Z860101 Personal history of adenomatous and serrated colon polyps: Secondary | ICD-10-CM

## 2023-07-09 DIAGNOSIS — K219 Gastro-esophageal reflux disease without esophagitis: Secondary | ICD-10-CM

## 2023-07-09 DIAGNOSIS — R195 Other fecal abnormalities: Secondary | ICD-10-CM | POA: Diagnosis not present

## 2023-07-09 NOTE — Patient Instructions (Addendum)
_______________________________________________________  If your blood pressure at your visit was 140/90 or greater, please contact your primary care physician to follow up on this.  _______________________________________________________  If you are age 63 or older, your body mass index should be between 23-30. Your Body mass index is 28.89 kg/m. If this is out of the aforementioned range listed, please consider follow up with your Primary Care Provider.  If you are age 19 or younger, your body mass index should be between 19-25. Your Body mass index is 28.89 kg/m. If this is out of the aformentioned range listed, please consider follow up with your Primary Care Provider.   ________________________________________________________  The [facility] providers would like to encourage you to use [facility] to communicate with providers for non-urgent requests or questions.  Due to long hold times on the telephone, sending your provider a message by [facility] may be a faster and more efficient way to get a response.  Please allow 48 business hours for a response.  Please remember that this is for non-urgent requests.  _______________________________________________________  Bonita Quin will follow up in our office on an as needed basis.  Thank you for entrusting me with your care and choosing [facility].  Dr Doy Hutching

## 2023-08-17 DIAGNOSIS — I10 Essential (primary) hypertension: Secondary | ICD-10-CM | POA: Diagnosis not present

## 2023-08-24 ENCOUNTER — Encounter: Payer: Self-pay | Admitting: *Deleted

## 2023-08-25 ENCOUNTER — Encounter: Payer: Self-pay | Admitting: Neurology

## 2023-08-25 ENCOUNTER — Ambulatory Visit: Payer: BC Managed Care – PPO | Admitting: Neurology

## 2023-08-25 VITALS — BP 129/80 | HR 67 | Ht 73.0 in | Wt 219.0 lb

## 2023-08-25 DIAGNOSIS — R42 Dizziness and giddiness: Secondary | ICD-10-CM | POA: Diagnosis not present

## 2023-08-25 DIAGNOSIS — I6381 Other cerebral infarction due to occlusion or stenosis of small artery: Secondary | ICD-10-CM

## 2023-08-25 DIAGNOSIS — R419 Unspecified symptoms and signs involving cognitive functions and awareness: Secondary | ICD-10-CM

## 2023-08-25 DIAGNOSIS — G4733 Obstructive sleep apnea (adult) (pediatric): Secondary | ICD-10-CM | POA: Diagnosis not present

## 2023-08-25 NOTE — Patient Instructions (Signed)
 It was nice to see you again.   Please continue using your autoPAP regularly. While your insurance requires that you use PAP at least 4 hours each night on 70% of the nights, I recommend, that you not skip any nights and use it throughout the night if you can. Getting used to PAP and staying with the treatment long term does take time and patience and discipline. Untreated obstructive sleep apnea when it is moderate to severe can have an adverse impact on cardiovascular health and raise her risk for heart disease, arrhythmias, hypertension, congestive heart failure, stroke and diabetes. Untreated obstructive sleep apnea causes sleep disruption, nonrestorative sleep, and sleep deprivation. This can have an impact on your day to day functioning and cause daytime sleepiness and impairment of cognitive function, memory loss, mood disturbance, and problems focussing. Using PAP regularly can improve these symptoms.  I recommend you talk to Dr. Felipa Eth about coming off the hydrocodone and possibly the Ambien, as you have had complaints of feeling dizzy and foggy headed. Medication side effects may play a role.   As discussed, secondary prevention is key after a stroke. This means: taking care of blood sugar values or diabetes management (A1c goal of less than 7.0), good blood pressure (hypertension) control and optimizing cholesterol management (with LDL goal of less than 70), exercising daily or regularly within your own mobility limitations of course, and overall cardiovascular risk factor reduction, which includes screening for and treatment of obstructive sleep apnea (OSA) and weight management.   Take aspirin 325 mg daily.   Follow up with cardiology. I recommend an updated echocardiogram.   We will do a carotid Doppler ultrasound. We will call you with the test results. Likely, they will call to schedule after insurance authorizes the test. Most likely, you will go to [facility] for the test, but  you may be able to choose another location if preferred.      Unfortunately, dizziness is a very common complaint but is often not due to a primary neurological reason or single underlying medical problem. Often, there a combination of factors, that result in dizziness. This includes blood pressure fluctuations, medication side effects, blood sugar fluctuations, stress, vertigo, poor sleep with sleep deprivation, dehydration, and electrolyte disturbance or other metabolic and endocrinological reasons, meaning hormone related problems such as thyroid dysfunction.

## 2023-08-25 NOTE — Progress Notes (Signed)
 Subjective:    Patient ID: Keith Stone is a 63 y.o. male.  HPI    [name], MD, PhD [facility] [address] [address] [place], [place] [address]  Dear Dr. Felipa Eth,  I saw your patient, Keith Stone, upon your kind request in my neurologic clinic today for initial presentation of his history of stroke.  The patient is unaccompanied today.  As you know, Mr. [name] is a 63 year old male with an underlying complex medical history of TIA, prior strokes, prostate cancer, low testosterone, reflux disease, severe OSA, hyperlipidemia, prior smoking, hypertension, allergic rhinitis, and overweight state, who reports an approximately 2-year history of intermittent dizziness and foggy headedness.  He became significantly ill a few months back and had significant weight loss at the time, saw multiple specialists including GI.  No single cause was found for his GI illness and weight loss at the time.  He denies any sudden onset of one-sided weakness or numbness or tingling or droopy face or slurring of speech, he does have tinnitus but denies vertiginous symptoms, feels like he is off balance when he stands up from bending down.  He denies lightheadedness or feeling faint.  He tries to hydrate well, estimates that he drinks about half a gallon to three quarters of a gallon of water per day.  He drinks caffeine in the form of coffee, 1 cup in the morning and a small soda at 6 PM.  He does not sleep well.  Of note, he takes Ambien every night and takes hydrocodone as needed, maybe once or twice a week.  He is scheduled to see cardiology soon.  He quit smoking in 2022.  He drinks alcohol about twice a month, 1-2 beers.  His weight has stabilized.  He goes to an eye doctor once a year and has contact lenses.    He denies any recent visual symptoms such as blurry vision or loss of vision.  Years ago he had visual symptoms transiently.  His Epworth sleepiness score is 0 out  of 24.  We have previously seen him in our sleep clinic but he has not been seen in our clinic since December 2023.  I was able to review his most recent AutoPap compliance data from the past month, he used his machine 20 out of 30 days between 07/25/2023 and 08/23/2023 with percent use days greater than 4 hours at 47%, indicating suboptimal compliance with an average usage for days on treatment of 4 hours and 54 minutes.  Residual AHI at goal at 2.7/h, 95th percentile of pressure at 10.7 cm with a range of 6 to 14 cm with EPR of 3.  Leak acceptable with the 95th percentile at 14.7 L/min.  He has had recent weight loss.  He had an office visit with you on 08/17/2023 and I reviewed the note.  He reported at the time that he was on losartan, felodipine, Ambien and occasional hydrocodone for back issues.  He had stopped taking cholesterol medication and carvedilol, Lexapro at the time.   He had a head CT without contrast through [facility] on 07/02/2023 and I reviewed the results: Impression:  1.  No acute intracranial abnormality. Remote appearing lacunar infarction in the right caudate nucleus. Consider MRI to further assess for acute infarct or other causes of reported dizziness as clinically warranted.  2.  Prominence of the optic nerve sheaths without overt radiographic papilledema. Suggest correlation without ophthalmologic exam.  3.  Calvarial vault defect  in the midline parietal region described above, with soft tissue abnormality extending from the underlying superior sagittal sinus into the adjacent scalp soft tissues. This is most suggestive of sinus pericranii. Relationship to the patient's presentation is uncertain, and this could be an incidental finding. MRI brain with and without contrast can be considered to assess for an associated atretic parietal cephalocele and could further delineate the underlying venous anatomy if clinically indicated.   He had a brain MRI with and without contrast on  07/21/2023, also through [facility] and I reviewed the results: Impression:  1.  No acute intracranial abnormality, specifically no acute infarctions.  2.  Remote left thalamus, left parietal white matter, and right caudate head infarctions.   He had blood work through your office on 06/22/2023 including CBC and CMP.  I reviewed the results.  These were benign.  TSH on 04/13/2023 was normal at 0.975.  Lipid panel in June 2024 showed LDL of 47.  Previously:   06/03/2022 Margie Ege, NP, video visit): <<[name] had HST 02/25/22 showing in severe OSA. This home sleep test demonstrates severe obstructive sleep apnea with a total AHI of 37.1/hour and O2 nadir of 76% with significant time below or at 88% saturation of nearly 20 minutes for the night.    Today we proceeded with telephone visit, he couldn't get logged into my chart. He doesn't love using CPAP. Has chronic insomnia, some nights he doesn't sleep. He no longer snores. He can't say that his energy level has increased or that his sleep quality is better, but that wasn't a concern to prompt sleep study consultation.    Using full face mask with nasal cushion. Denies any leaking. He works as Medical illustrator with local travel.    Review of data 05/04/22-06/02/22 uses 22/30 days at 73%, greater than 4 hours 19 days 63%.  Average usage days used 5 hours 46 minutes.  Minimum pressure 6, maximum pressure 14.  Leak in the 95 percentile 34.9, AHI 6.4.  95th percentile pressure 10.2. >>  01/16/2022 (SA): 63 year old right-handed gentleman with an underlying medical history of asthma, reflux disease, palpitations, allergic rhinitis, prostate cancer, prior smoking with recent cessation, palpitations, and borderline obesity, who reports snoring and excessive daytime somnolence. I reviewed your office note from 11/12/2021.  He was diagnosed with sleep apnea and placed on PAP therapy several years ago.  He is currently not on PAP therapy for years.  He  brought his old CPAP machine, he has a Corporate treasurer, had trouble tolerating the nasal mask, was getting his supplies online, testing was probably around 15 years ago in [place] as he recalls.   He has had weight gain and significant weight fluctuation in the past few years.  His Epworth sleepiness score is 3 out of 24, fatigue severity score is 19 out of 63.  He does not wake up rested.  He is tired during the day but does not have a tendency to doze off.  He takes care of all of his family his mom who is 68.  He lives with his wife, his 81 year old son lives with them, he has sleep apnea as well.  Patient reports a further family history of sleep apnea affecting his dad who had a CPAP before he passed and his brother who has a CPAP machine.  Patient goes to bed between 10 and 11 and rise time is between 7:30 AM and 8 AM.  He has nocturia about 2-3 times per average night.  He has a history of radiation cystitis.  He denies recurrent morning or nocturnal headaches.  He drinks caffeine in the form of coffee, typically 1 cup/day, quit smoking earlier this month, drinks alcohol very occasionally, maybe once a month.  His snoring has been loud at times, he has woken up with a sense of gasping, his wife has noted pauses in his breathing which concern her.  He would like to pursue a home sleep test.  He had a tonsillectomy as a child.   His Past Medical History Is Significant For: Past Medical History:  Diagnosis Date   Allergic rhinitis    Anxiety    Biochemically recurrent malignant neoplasm of prostate (HCC) 02/2019   urologist--- dr herrick/  radiation oncologist--- dr Kathrynn Running   first dx 2015  s/p robot radical prostatectomy (Gleason 3+4);   recurrent elevated PSA ,  completed IMRT 05-12-2019   Childhood asthma    DJD (degenerative joint disease)    Dysuria    ED (erectile dysfunction)    GERD (gastroesophageal reflux disease)    occ uses alka seltzer   Gross hematuria    History of  adenomatous polyp of colon    History of asthma    child   History of COVID-19 06/2019   per pt mild symptoms that resolved   History of external beam radiation therapy    prostatic fossa   03-22-2019  to 05-12-2019   Hyperlipidemia    Hypertension    Insomnia    Lower urinary tract symptoms (LUTS)    OA (osteoarthritis)    OSA (obstructive sleep apnea)    per pt no cpap since approx   Palpitations    cardiologist-- dr Anne Fu   Stroke [facility])    Tobacco abuse    Venous insufficiency    Wears contact lenses     His Past Surgical History Is Significant For: Past Surgical History:  Procedure Laterality Date   BILATERAL KNEE ARTHROSCOPY     1996;  1998   COLONOSCOPY WITH PROPOFOL  04/04/2021   Dr.Stark   CYSTOSCOPY WITH BIOPSY N/A 11/14/2021   Procedure: CYSTOSCOPY WITH  BLADDER BIOPSY/ FULGURATION/ RETROGRADE;  Surgeon: Crist Fat, MD;  Location: [facility];  Service: Urology;  Laterality: N/A;   LYMPHADENECTOMY Bilateral 02/22/2014   Procedure: PELVIC LYMPH NODE DISSECTION;  Surgeon: Valetta Fuller, MD;  Location: [facility];  Service: Urology;  Laterality: Bilateral;   ROBOT ASSISTED LAPAROSCOPIC RADICAL PROSTATECTOMY N/A 02/22/2014   Procedure: ROBOTIC ASSISTED LAPAROSCOPIC RADICAL PROSTATECTOMY;  Surgeon: Valetta Fuller, MD;  Location: [facility];  Service: Urology;  Laterality: N/A;   TONSILLECTOMY     child   TYMPANOSTOMY TUBE PLACEMENT Bilateral    several times as child and last time approx 2008    His Family History Is Significant For: Family History  Problem Relation Age of Onset   Hypertension Mother    Arthritis Mother    Fibromyalgia Mother    Food Allergy Mother    Macular degeneration Mother    Atrial fibrillation Father    Kidney Stones Father    Heart attack Father    Hypertension Father    Colon polyps Father    Lung cancer Father    Sleep apnea Father    Skin cancer Father    Cancer - Lung Father    Arthritis Father     Fibromyalgia Father    Hypertension Brother    Colon polyps Brother    Skin cancer Brother  Sleep apnea Brother    Coronary artery disease Maternal Grandmother    Bipolar disorder Maternal Grandmother    Coronary artery disease Maternal Grandfather    Cancer Maternal Grandfather    Sleep apnea Son    Lymphoma Paternal Uncle    Colon cancer Neg Hx    Esophageal cancer Neg Hx    Stomach cancer Neg Hx    Rectal cancer Neg Hx    Allergic rhinitis Neg Hx    Angioedema Neg Hx    Asthma Neg Hx    Eczema Neg Hx    Immunodeficiency Neg Hx    Urticaria Neg Hx    Stroke Neg Hx     His Social History Is Significant For: Social History   Socioeconomic History   Marital status: Married    Spouse name: Not on file   Number of children: 3   Years of education: Not on file   Highest education level: Not on file  Occupational History   Occupation: Airline pilot Rep at [employer]  Tobacco Use   Smoking status: Every Day    Current packs/day: 0.00    Average packs/day: 1 pack/day for 46.0 years (46.0 ttl pk-yrs)    Types: E-cigarettes, Cigarettes    Start date: 12/22/1975    Last attempt to quit: 12/21/2021    Years since quitting: 1.6   Smokeless tobacco: Current   Tobacco comments:    Smoke cigarettes since age 64 but quit December 24, 2020. Still smokes a Therapist, art Use   Vaping status: Every Day   Substances: Nicotine  Substance and Sexual Activity   Alcohol use: Yes    Comment: occasional   Drug use: No    Comment: hemp gummy sleep aid prn   Sexual activity: Yes    Birth control/protection: Surgical    Comment: vasectomy in 2000  Other Topics Concern   Not on file  Social History Narrative   Caffeine: 1 cup coffee/day, maybe 1 small coke at the end of the day   Right handed   Social Drivers of Corporate investment banker Strain: Not on file  Food Insecurity: Not on file  Transportation Needs: Not on file  Physical Activity: Not on file  Stress: Not on file  Social  Connections: Not on file    His Allergies Are:  Allergies  Allergen Reactions   Alpha-Gal Anaphylaxis    This includes beef, pork, and lamb  :   His Current Medications Are:  Outpatient Encounter Medications as of 08/25/2023  Medication Sig   albuterol (VENTOLIN HFA) 108 (90 Base) MCG/ACT inhaler Inhale 2 puffs into the lungs every 6 (six) hours as needed for wheezing or shortness of breath.   cetirizine (ZYRTEC) 10 MG tablet Take 10 mg by mouth daily as needed.   EPINEPHrine 0.3 mg/0.3 mL IJ SOAJ injection Inject 0.3 mg into the muscle as needed for anaphylaxis.   felodipine (PLENDIL) 10 MG 24 hr tablet Take 10 mg by mouth daily.   HYDROcodone-acetaminophen (NORCO/VICODIN) 5-325 MG tablet    losartan-hydrochlorothiazide (HYZAAR) 100-25 MG per tablet Take 0.5 tablets by mouth 2 (two) times daily.   prednisoLONE acetate (PRED FORTE) 1 % ophthalmic suspension Place 1 drop into both eyes as needed.   Probiotic Product (PROBIOTIC PO) Take by mouth 2 (two) times a week.   rosuvastatin (CRESTOR) 20 MG tablet Take 20 mg by mouth daily.   zolpidem (AMBIEN) 10 MG tablet Take 10 mg by mouth at bedtime  as needed.   [DISCONTINUED] pantoprazole (PROTONIX) 40 MG tablet Take 40 mg by mouth daily. 1/2-1 hour before morning meal (Patient not taking: Reported on 08/25/2023)   No facility-administered encounter medications on file as of 08/25/2023.  :   Review of Systems:  Out of a complete 14 point review of systems, all are reviewed and negative with the exception of these symptoms as listed below:  Review of Systems  Neurological:        Patient is here alone for referral for hx of strokes seen on brain MRI, brain fog, and dizziness. He states about 6 years he had a bad reaction to alpha gal and then in 2023 when his mother passed he an episode where he was seeing flags in his visual field, saw different colored eyes in family members and also trouble speaking. He wonders if he had strokes during those  two events. His brain MRI occurred last December. He states this was done after he got sick in October, lost weight, couldn't eat. In the process of trying to find out what was going on he had the MRI done and they found the strokes. He states brain fog is nearly daily. He states if he gets on a ladder, looks up,  bends over quickly, stoops, he may feel off balance. He has a history of Tinnitus as well. Patient states he uses his cpap regularly. He does dislike it but states he feels benefit from it. He has been purchasing his supplies online. ESS 0    Objective:  Neurological Exam  Physical Exam Physical Examination:   Vitals:   08/25/23 0913  BP: 129/80  Pulse: 67    General Examination: The patient is a very pleasant 63 y.o. male in no acute distress. He appears well-developed and well-nourished and well groomed.   HEENT: Normocephalic, atraumatic, pupils are equal, round and reactive to light, extraocular tracking is good without limitation to gaze excursion or nystagmus noted. Hearing is grossly intact. Face is symmetric with normal facial animation. Speech is clear with no dysarthria noted. There is no hypophonia. There is no lip, neck/head, jaw or voice tremor. Neck is supple with full range of passive and active motion. There are no carotid bruits on auscultation. Oropharynx exam reveals: No significant mouth dryness, tongue protrudes centrally and palate elevates symmetrically.     Chest: Clear to auscultation without wheezing, rhonchi or crackles noted.   Heart: S1+S2+0, regular and normal without murmurs, rubs or gallops noted.    Abdomen: Soft, non-tender and non-distended.   Extremities: There is no pitting edema in the distal lower extremities bilaterally.    Skin: Warm and dry without trophic changes noted.    Musculoskeletal: exam reveals no obvious joint deformities.    Neurologically:  Mental status: The patient is awake, alert and oriented in all 4 spheres. His  immediate and remote memory, attention, language skills and fund of knowledge are appropriate. There is no evidence of aphasia, agnosia, apraxia or anomia. Speech is clear with normal prosody and enunciation. Thought process is linear. Mood is normal and affect is normal.  Cranial nerves II - XII are as described above under HEENT exam.  Motor exam: Normal bulk, strength and tone is noted. There is no tremor, no drift or rebound.   Romberg is negative. Reflexes are 1+ throughout, toes downgoing bilaterally. Fine motor skills and coordination: grossly intact with finger taps, hand movements and rapid alternating patting with both upper extremities, normal foot taps bilaterally in the  lower extremities.  Cerebellar testing: No dysmetria or intention tremor. There is no truncal or gait ataxia.  Normal finger-to-nose, normal heel-to-shin bilaterally. Sensory exam: intact to light touch in the upper and lower extremities.  Gait, station and balance: He stands easily. No veering to one side is noted. No leaning to one side is noted. Posture is age-appropriate and stance is narrow based. Gait shows normal stride length and normal pace. No problems turning are noted.  Normal tandem walk.   Assessment and plan:    In summary, MATHEU PLOEGER is a 63 year old male with an underlying complex medical history of TIA, prior strokes, prostate cancer, low testosterone, reflux disease, severe OSA, hyperlipidemia, prior smoking, hypertension, allergic rhinitis, and overweight state, who presents for evaluation of his intermittent dizziness and foggy headedness of about 2 years duration with incidental findings of old lacunar strokes on recent brain MRI.  He has multiple risk factors for vascular complications including prior smoking, hypertension, hyperlipidemia and severe OSA.  We talked about secondary stroke prevention quite a bit today.  We talked about dizziness and possible causes.  He is advised to continue to see  cardiology and he is pending an appointment.  He is advised to get an updated echocardiogram.  We will proceed with a carotid Doppler ultrasound through our office.  He is advised to stay well-hydrated and be more consistent with his AutoPap machine as he is currently not fully compliant.  Taking potentially sedating medications such as Ambien and hydrocodone may increase his risk for foggy headedness and dizziness.   We will keep him posted as to his carotid Doppler ultrasound results by phone call.  He is advised to follow-up with your office regularly.  We will do some blood work today and call him with the results as well.  He is advised to increase his aspirin from baby aspirin to adult size aspirin daily.  I answered all his questions today and he was in agreement.  This was an extended visit of over 60 minutes with copious record review involved and considerable counseling and coordination of care.  He is advised to follow-up routinely in our clinic for sleep apnea checkup in 1 year.   Thank you very much for allowing me to participate in the care of this nice patient. If I can be of any further assistance to you please do not hesitate to call me at [phone].   Sincerely,     [name], MD, PhD

## 2023-08-27 ENCOUNTER — Encounter: Payer: Self-pay | Admitting: Neurology

## 2023-08-27 ENCOUNTER — Telehealth: Payer: Self-pay | Admitting: *Deleted

## 2023-08-27 LAB — VITAMIN D 25 HYDROXY (VIT D DEFICIENCY, FRACTURES): Vit D, 25-Hydroxy: 25.5 ng/mL — ABNORMAL LOW (ref 30.0–100.0)

## 2023-08-27 LAB — TSH: TSH: 0.964 u[IU]/mL (ref 0.450–4.500)

## 2023-08-27 LAB — HGB A1C W/O EAG: Hgb A1c MFr Bld: 5.7 % — ABNORMAL HIGH (ref 4.8–5.6)

## 2023-08-27 LAB — B12 AND FOLATE PANEL
Folate: 5.8 ng/mL (ref >3.0)
Vitamin B-12: 475 pg/mL (ref 232–1245)

## 2023-08-27 NOTE — Telephone Encounter (Signed)
 Relayed results of labs to pt.  Follow up with pcp on vit d, prediabetes.  He verbalized understanding.

## 2023-08-27 NOTE — Telephone Encounter (Signed)
-----   Message from [name] sent at 08/27/2023  7:12 AM EST ----- FYI, see MyChart message to patient.

## 2023-09-01 ENCOUNTER — Ambulatory Visit (HOSPITAL_COMMUNITY)
Admission: RE | Admit: 2023-09-01 | Discharge: 2023-09-01 | Disposition: A | Discharge status: Home / Self Care | Source: Ambulatory Visit | Attending: Neurology | Admitting: Neurology

## 2023-09-01 DIAGNOSIS — G4733 Obstructive sleep apnea (adult) (pediatric): Secondary | ICD-10-CM | POA: Insufficient documentation

## 2023-09-01 DIAGNOSIS — R419 Unspecified symptoms and signs involving cognitive functions and awareness: Secondary | ICD-10-CM | POA: Insufficient documentation

## 2023-09-01 DIAGNOSIS — R42 Dizziness and giddiness: Secondary | ICD-10-CM | POA: Insufficient documentation

## 2023-09-01 DIAGNOSIS — I6381 Other cerebral infarction due to occlusion or stenosis of small artery: Secondary | ICD-10-CM | POA: Insufficient documentation

## 2023-09-02 ENCOUNTER — Encounter: Payer: Self-pay | Admitting: Neurology

## 2023-09-02 ENCOUNTER — Telehealth: Payer: Self-pay | Admitting: *Deleted

## 2023-09-02 NOTE — Telephone Encounter (Signed)
 I called pt and relayed via VM (ok per dpr) the results of the carotid doppler/ultrasound results.  Per Dr. Frances Furbish :     Your carotid Doppler study which is the ultrasound of the main neck arteries showed benign findings, no significant "hardening" of the arteries or blockages, which is reassuring.  Please continue with your AutoPap machine with full compliance.  You can follow-up in our sleep clinic to see Margie Ege, NP in about a year.  Pt to call back if questions and schedule appt.

## 2023-09-02 NOTE — Telephone Encounter (Signed)
-----   Message from [name] sent at 09/02/2023 11:38 AM EDT ----- MyChart message to patient, please reach out to him via MyChart to schedule a 1 year follow-up with [name] for his sleep apnea.

## 2023-09-21 ENCOUNTER — Ambulatory Visit (HOSPITAL_BASED_OUTPATIENT_CLINIC_OR_DEPARTMENT_OTHER): Payer: BC Managed Care – PPO | Admitting: Cardiology

## 2023-09-21 ENCOUNTER — Encounter (HOSPITAL_BASED_OUTPATIENT_CLINIC_OR_DEPARTMENT_OTHER): Payer: Self-pay | Admitting: Cardiology

## 2023-09-21 VITALS — BP 128/78 | HR 72 | Ht 73.0 in | Wt 223.0 lb

## 2023-09-21 DIAGNOSIS — R002 Palpitations: Secondary | ICD-10-CM | POA: Diagnosis not present

## 2023-09-21 DIAGNOSIS — I2510 Atherosclerotic heart disease of native coronary artery without angina pectoris: Secondary | ICD-10-CM | POA: Diagnosis not present

## 2023-09-21 DIAGNOSIS — Z8249 Family history of ischemic heart disease and other diseases of the circulatory system: Secondary | ICD-10-CM

## 2023-09-21 DIAGNOSIS — T781XXA Other adverse food reactions, not elsewhere classified, initial encounter: Secondary | ICD-10-CM

## 2023-09-21 DIAGNOSIS — T781XXD Other adverse food reactions, not elsewhere classified, subsequent encounter: Secondary | ICD-10-CM

## 2023-09-21 MED ORDER — ASPIRIN 81 MG PO TBEC: 81.0000 mg | DELAYED_RELEASE_TABLET | Freq: Every day | ORAL | Status: AC

## 2023-09-21 NOTE — Patient Instructions (Signed)
 Medication Instructions:  Please take Asa 81 mg a day. Continue all other medications as listed.  *If you need a refill on your cardiac medications before your next appointment, please call your pharmacy*  Follow-Up: At [facility], you and your health needs are our priority.  As part of our continuing mission to provide you with exceptional heart care, our providers are all part of one team.  This team includes your primary Cardiologist (physician) and Advanced Practice Providers or APPs (Physician Assistants and Nurse Practitioners) who all work together to provide you with the care you need, when you need it.  Your next appointment:   Follow up as needed.   We recommend signing up for the patient portal called "MyChart".  Sign up information is provided on this After Visit Summary.  MyChart is used to connect with patients for Virtual Visits (Telemedicine).  Patients are able to view lab/test results, encounter notes, upcoming appointments, etc.  Non-urgent messages can be sent to your provider as well.   To learn more about what you can do with MyChart, go to [email or web address].

## 2023-09-21 NOTE — Progress Notes (Signed)
 Cardiology Office Note:  .   Date:  09/21/2023  ID:  LAMBROS CERRO, DOB 06/24/1960, MRN [id number] PCP: Chilton Greathouse, MD  [facility] Providers Cardiologist:  None     History of Present Illness: Keith Stone   Keith Stone is a 63 y.o. male Discussed the use of AI scribe software for clinical note transcription with the patient, who gave verbal consent to proceed.  History of Present Illness WRIGLEY PLASENCIA is a 63 year old male with a history of dizziness and mini strokes who presents for follow-up.  He has experienced dizziness for a couple of years, often triggered by looking up or engaging in activities like painting. He associates this dizziness with an alpha-gal reaction that occurred between October and December of the previous year, during which he was unable to eat, became sick, and lost a significant amount of weight.  He has a history of severe shortness of breath with syncope, suspected to be an anaphylactic reaction to alpha-gal. During this episode, his oxygen saturation levels dropped into the eighties. He believes that the mini strokes identified on a recent MRI may have occurred during this reaction five years ago when his blood pressure dropped significantly.  He has a history of paroxysmal atrial tachycardia. A monitor placed in September 2022 showed sinus rhythm, an average heart rate of 70, six brief episodes of paroxysmal atrial tachycardia, and rare PVCs and PACs. He experiences periodic PVCs, describing them as feeling like his heart 'will stop and just kind of bounce.'  He is currently taking half a tablet of losartan/hydrochlorothiazide 100/25 mg twice daily, Crestor 20 mg daily, and felodipine 10 mg daily. He also takes aspirin 81 mg daily. He had stopped all medications for about a month in December to rule out causes of his symptoms but resumed them on January 25. He follows a Mediterranean diet, primarily consuming chicken, vegetables, and fruit due to  dietary restrictions from alpha-gal. He notes a significant lifestyle change from his previous diet of pizza and cheeseburgers.  He has a family history of early coronary disease, with his father having had a myocardial infarction at age 2. His prior LDL was 86, hemoglobin 16.7, and creatinine 1.4. His coronary calcium score in September 2022 was 29, placing him in the 53rd percentile. His last LDL was 47 as of November 26, 2022.  No significant changes in symptoms since resuming medications.      ROS: No CP, no SOB  Studies Reviewed: Keith Stone   EKG Interpretation Date/Time:  Monday September 21 2023 09:06:51 EDT Ventricular Rate:  67 PR Interval:  178 QRS Duration:  94 QT Interval:  438 QTC Calculation: 462 R Axis:   46  Text Interpretation: Normal sinus rhythm Nonspecific ST abnormality When compared with ECG of 12-Mar-2020 17:06, ST segment changes are less pronounced Confirmed by Donato Schultz ([id number]) on 09/21/2023 9:24:33 AM    Results LABS LDL: 47 (11/26/2022) Hb: 16.7 Cr: 1.4 HbA1c: 5.7  RADIOLOGY Coronary calcium score: 29 (03/14/2021) MRI brain: Remote left thalamus, left parietal white matter, and right caudate head infarctions (07/21/2023) Carotid ultrasound: Minimal thickening or plaque, no significant narrowing (08/2023)  DIAGNOSTIC Holter monitor: Sinus rhythm, average heart rate 70, six brief episodes of paroxysmal atrial tachycardia, rare PVCs and PACs (03/12/2021) EKG: Normal Risk Assessment/Calculations:            Physical Exam:   VS:  BP 128/78   Pulse 72   Ht 6\' 1"  (1.854 m)   Wt  223 lb (101.2 kg)   SpO2 97%   BMI 29.42 kg/m    Wt Readings from Last 3 Encounters:  09/21/23 223 lb (101.2 kg)  08/25/23 219 lb (99.3 kg)  07/09/23 219 lb (99.3 kg)    GEN: Well nourished, well developed in no acute distress NECK: No JVD; No carotid bruits CARDIAC: RRR, no murmurs, no rubs, no gallops RESPIRATORY:  Clear to auscultation without rales, wheezing or rhonchi   ABDOMEN: Soft, non-tender, non-distended EXTREMITIES:  No edema; No deformity   ASSESSMENT AND PLAN: .    Assessment and Plan Assessment & Plan Paroxysmal Atrial Tachycardia Paroxysmal atrial tachycardia with six brief episodes on previous monitoring. Current medications exclude beta blockers. Condition causes annoyance but no significant harm. Presence of PVCs and PACs is non-harmful.  Coronary Artery Disease Mild calcified plaque on coronary calcium score from 2022. Current LDL is 47, providing protection against future cerebrovascular and cardiovascular events. On rosuvastatin 20 mg daily to stabilize plaque. Coronary calcium score in the 53rd percentile indicates mild risk.  Hypertension Blood pressure managed with losartan/hydrochlorothiazide and felodipine. Takes half a tablet of losartan/hydrochlorothiazide twice daily and felodipine 10 mg daily. Blood pressure is well-controlled. Previously discontinued carvedilol without adverse effects.  History of Stroke Remote infarctions in left thalamus, left parietal white matter, and right caudate head. On aspirin 81 mg daily for secondary prevention. Recent carotid ultrasound shows minimal plaque with no significant narrowing. Aspirin use monitored for bleeding risks.  Dizziness Dizziness present for a couple of years, possibly related to previous alpha-gal reaction. No acute changes. Reports improvement in symptoms. Dizziness may be exacerbated by activities such as looking up or painting.  Prediabetes Hemoglobin A1c is 5.7, indicating prediabetes. Following a Mediterranean diet and significant lifestyle changes, including dietary modifications due to alpha-gal allergy, beneficial for cardiovascular health.  Alpha-gal Allergy Severe reaction to alpha-gal, leading to significant lifestyle and dietary changes. Managing well with a diet of chicken, vegetables, and fruits. Dietary change beneficial for overall  health.  Follow-up Well-managed with current management. Advised to continue current medications and lifestyle modifications. Encouraged to maintain regular follow-up with primary care physician. - Advise to continue current medications and lifestyle modifications. - Instruct to return to the clinic if any new symptoms arise or if there are changes in current symptoms. - Encourage to maintain regular follow-up with primary care physician. -Graduate from cardiology clinic. Please reach out if assistance is needed in future.         Signed, Donato Schultz, MD

## 2023-09-22 DIAGNOSIS — L578 Other skin changes due to chronic exposure to nonionizing radiation: Secondary | ICD-10-CM | POA: Diagnosis not present

## 2023-09-22 DIAGNOSIS — D0471 Carcinoma in situ of skin of right lower limb, including hip: Secondary | ICD-10-CM | POA: Diagnosis not present

## 2023-09-22 DIAGNOSIS — D485 Neoplasm of uncertain behavior of skin: Secondary | ICD-10-CM | POA: Diagnosis not present

## 2023-09-22 DIAGNOSIS — L821 Other seborrheic keratosis: Secondary | ICD-10-CM | POA: Diagnosis not present

## 2023-09-22 DIAGNOSIS — D225 Melanocytic nevi of trunk: Secondary | ICD-10-CM | POA: Diagnosis not present

## 2023-09-22 DIAGNOSIS — D2362 Other benign neoplasm of skin of left upper limb, including shoulder: Secondary | ICD-10-CM | POA: Diagnosis not present

## 2023-10-06 DIAGNOSIS — D0471 Carcinoma in situ of skin of right lower limb, including hip: Secondary | ICD-10-CM | POA: Diagnosis not present

## 2023-12-01 DIAGNOSIS — K9041 Non-celiac gluten sensitivity: Secondary | ICD-10-CM | POA: Diagnosis not present

## 2023-12-01 DIAGNOSIS — A6920 Lyme disease, unspecified: Secondary | ICD-10-CM | POA: Diagnosis not present

## 2023-12-01 DIAGNOSIS — Z131 Encounter for screening for diabetes mellitus: Secondary | ICD-10-CM | POA: Diagnosis not present

## 2023-12-01 DIAGNOSIS — D729 Disorder of white blood cells, unspecified: Secondary | ICD-10-CM | POA: Diagnosis not present

## 2023-12-01 DIAGNOSIS — E279 Disorder of adrenal gland, unspecified: Secondary | ICD-10-CM | POA: Diagnosis not present

## 2023-12-01 DIAGNOSIS — Z1329 Encounter for screening for other suspected endocrine disorder: Secondary | ICD-10-CM | POA: Diagnosis not present

## 2023-12-01 DIAGNOSIS — D518 Other vitamin B12 deficiency anemias: Secondary | ICD-10-CM | POA: Diagnosis not present

## 2023-12-01 DIAGNOSIS — Z125 Encounter for screening for malignant neoplasm of prostate: Secondary | ICD-10-CM | POA: Diagnosis not present

## 2023-12-01 DIAGNOSIS — E785 Hyperlipidemia, unspecified: Secondary | ICD-10-CM | POA: Diagnosis not present

## 2023-12-01 DIAGNOSIS — B960 Mycoplasma pneumoniae [M. pneumoniae] as the cause of diseases classified elsewhere: Secondary | ICD-10-CM | POA: Diagnosis not present

## 2023-12-01 DIAGNOSIS — B2790 Infectious mononucleosis, unspecified without complication: Secondary | ICD-10-CM | POA: Diagnosis not present

## 2023-12-01 DIAGNOSIS — R946 Abnormal results of thyroid function studies: Secondary | ICD-10-CM | POA: Diagnosis not present

## 2023-12-01 DIAGNOSIS — E538 Deficiency of other specified B group vitamins: Secondary | ICD-10-CM | POA: Diagnosis not present

## 2023-12-01 DIAGNOSIS — E559 Vitamin D deficiency, unspecified: Secondary | ICD-10-CM | POA: Diagnosis not present

## 2023-12-01 DIAGNOSIS — K929 Disease of digestive system, unspecified: Secondary | ICD-10-CM | POA: Diagnosis not present

## 2023-12-01 DIAGNOSIS — E291 Testicular hypofunction: Secondary | ICD-10-CM | POA: Diagnosis not present

## 2023-12-04 DIAGNOSIS — C61 Malignant neoplasm of prostate: Secondary | ICD-10-CM | POA: Diagnosis not present

## 2023-12-04 DIAGNOSIS — E291 Testicular hypofunction: Secondary | ICD-10-CM | POA: Diagnosis not present

## 2023-12-04 DIAGNOSIS — E785 Hyperlipidemia, unspecified: Secondary | ICD-10-CM | POA: Diagnosis not present

## 2023-12-04 DIAGNOSIS — Z1389 Encounter for screening for other disorder: Secondary | ICD-10-CM | POA: Diagnosis not present

## 2023-12-04 DIAGNOSIS — Z1212 Encounter for screening for malignant neoplasm of rectum: Secondary | ICD-10-CM | POA: Diagnosis not present

## 2023-12-08 DIAGNOSIS — R82998 Other abnormal findings in urine: Secondary | ICD-10-CM | POA: Diagnosis not present

## 2023-12-08 DIAGNOSIS — I10 Essential (primary) hypertension: Secondary | ICD-10-CM | POA: Diagnosis not present

## 2023-12-08 DIAGNOSIS — Z0000 Encounter for general adult medical examination without abnormal findings: Secondary | ICD-10-CM | POA: Diagnosis not present

## 2024-01-20 DIAGNOSIS — E291 Testicular hypofunction: Secondary | ICD-10-CM | POA: Diagnosis not present

## 2024-01-20 DIAGNOSIS — R946 Abnormal results of thyroid function studies: Secondary | ICD-10-CM | POA: Diagnosis not present

## 2024-01-20 DIAGNOSIS — E279 Disorder of adrenal gland, unspecified: Secondary | ICD-10-CM | POA: Diagnosis not present

## 2024-01-20 DIAGNOSIS — K929 Disease of digestive system, unspecified: Secondary | ICD-10-CM | POA: Diagnosis not present

## 2024-04-01 DIAGNOSIS — E279 Disorder of adrenal gland, unspecified: Secondary | ICD-10-CM | POA: Diagnosis not present

## 2024-04-01 DIAGNOSIS — E291 Testicular hypofunction: Secondary | ICD-10-CM | POA: Diagnosis not present

## 2024-04-01 DIAGNOSIS — D518 Other vitamin B12 deficiency anemias: Secondary | ICD-10-CM | POA: Diagnosis not present

## 2024-04-01 DIAGNOSIS — E559 Vitamin D deficiency, unspecified: Secondary | ICD-10-CM | POA: Diagnosis not present

## 2024-04-01 DIAGNOSIS — K929 Disease of digestive system, unspecified: Secondary | ICD-10-CM | POA: Diagnosis not present

## 2024-04-01 DIAGNOSIS — R946 Abnormal results of thyroid function studies: Secondary | ICD-10-CM | POA: Diagnosis not present

## 2024-04-19 DIAGNOSIS — E291 Testicular hypofunction: Secondary | ICD-10-CM | POA: Diagnosis not present

## 2024-04-19 DIAGNOSIS — R946 Abnormal results of thyroid function studies: Secondary | ICD-10-CM | POA: Diagnosis not present

## 2024-04-19 DIAGNOSIS — E279 Disorder of adrenal gland, unspecified: Secondary | ICD-10-CM | POA: Diagnosis not present

## 2024-04-19 DIAGNOSIS — K929 Disease of digestive system, unspecified: Secondary | ICD-10-CM | POA: Diagnosis not present

## 2024-08-23 ENCOUNTER — Ambulatory Visit: Admitting: Neurology
